# Patient Record
Sex: Female | Born: 1985 | Race: White | Hispanic: No | Marital: Married | Smoking: Never smoker
Health system: Southern US, Community
[De-identification: ages and names within clinical notes are randomized; demographics above are authoritative.]

## PROBLEM LIST (undated history)

## (undated) DIAGNOSIS — K219 Gastro-esophageal reflux disease without esophagitis: Secondary | ICD-10-CM

## (undated) DIAGNOSIS — R112 Nausea with vomiting, unspecified: Secondary | ICD-10-CM

## (undated) DIAGNOSIS — F988 Other specified behavioral and emotional disorders with onset usually occurring in childhood and adolescence: Secondary | ICD-10-CM

## (undated) DIAGNOSIS — Z87442 Personal history of urinary calculi: Secondary | ICD-10-CM

## (undated) DIAGNOSIS — Z87898 Personal history of other specified conditions: Secondary | ICD-10-CM

## (undated) DIAGNOSIS — G4730 Sleep apnea, unspecified: Secondary | ICD-10-CM

## (undated) DIAGNOSIS — L858 Other specified epidermal thickening: Secondary | ICD-10-CM

## (undated) DIAGNOSIS — J302 Other seasonal allergic rhinitis: Secondary | ICD-10-CM

## (undated) DIAGNOSIS — M1990 Unspecified osteoarthritis, unspecified site: Secondary | ICD-10-CM

## (undated) DIAGNOSIS — L309 Dermatitis, unspecified: Secondary | ICD-10-CM

## (undated) DIAGNOSIS — Z9889 Other specified postprocedural states: Secondary | ICD-10-CM

## (undated) DIAGNOSIS — J45909 Unspecified asthma, uncomplicated: Secondary | ICD-10-CM

## (undated) DIAGNOSIS — M419 Scoliosis, unspecified: Secondary | ICD-10-CM

## (undated) DIAGNOSIS — R52 Pain, unspecified: Secondary | ICD-10-CM

## (undated) DIAGNOSIS — G43909 Migraine, unspecified, not intractable, without status migrainosus: Secondary | ICD-10-CM

## (undated) HISTORY — PX: OTHER SURGICAL HISTORY: SHX169

---

## 2003-08-09 HISTORY — PX: CHOLECYSTECTOMY: SHX55

## 2010-01-06 HISTORY — PX: LITHOTRIPSY: SUR834

## 2013-10-24 ENCOUNTER — Other Ambulatory Visit: Payer: Self-pay | Admitting: Urology

## 2013-10-25 ENCOUNTER — Encounter (HOSPITAL_COMMUNITY)
Admission: RE | Admit: 2013-10-25 | Discharge: 2013-10-25 | Disposition: A | Discharge status: Home / Self Care | Payer: 59 | Source: Ambulatory Visit | Attending: Urology | Admitting: Urology

## 2013-10-25 ENCOUNTER — Encounter (HOSPITAL_COMMUNITY): Payer: Self-pay | Admitting: Pharmacy Technician

## 2013-10-25 ENCOUNTER — Encounter (HOSPITAL_COMMUNITY): Payer: Self-pay

## 2013-10-25 HISTORY — DX: Migraine, unspecified, not intractable, without status migrainosus: G43.909

## 2013-10-25 HISTORY — DX: Other specified postprocedural states: Z98.890

## 2013-10-25 HISTORY — DX: Unspecified asthma, uncomplicated: J45.909

## 2013-10-25 HISTORY — DX: Personal history of other specified conditions: Z87.898

## 2013-10-25 HISTORY — DX: Personal history of urinary calculi: Z87.442

## 2013-10-25 HISTORY — DX: Scoliosis, unspecified: M41.9

## 2013-10-25 HISTORY — DX: Other specified behavioral and emotional disorders with onset usually occurring in childhood and adolescence: F98.8

## 2013-10-25 HISTORY — DX: Pain, unspecified: R52

## 2013-10-25 HISTORY — DX: Other seasonal allergic rhinitis: J30.2

## 2013-10-25 HISTORY — DX: Other specified epidermal thickening: L85.8

## 2013-10-25 HISTORY — DX: Nausea with vomiting, unspecified: R11.2

## 2013-10-25 HISTORY — DX: Dermatitis, unspecified: L30.9

## 2013-10-25 LAB — BASIC METABOLIC PANEL
BUN: 13 mg/dL (ref 6–23)
CO2: 21 mEq/L (ref 19–32)
Calcium: 9.4 mg/dL (ref 8.4–10.5)
Chloride: 107 mEq/L (ref 96–112)
Creatinine, Ser: 0.75 mg/dL (ref 0.50–1.10)
GFR calc Af Amer: >90 mL/min (ref >90)
GLUCOSE: 92 mg/dL (ref 70–99)
POTASSIUM: 3.8 meq/L (ref 3.7–5.3)
SODIUM: 141 meq/L (ref 137–147)

## 2013-10-25 LAB — CBC
HCT: 41.8 % (ref 36.0–46.0)
Hemoglobin: 14.4 g/dL (ref 12.0–15.0)
MCH: 28.9 pg (ref 26.0–34.0)
MCHC: 34.4 g/dL (ref 30.0–36.0)
MCV: 83.9 fL (ref 78.0–100.0)
Platelets: 281 10*3/uL (ref 150–400)
RBC: 4.98 MIL/uL (ref 3.87–5.11)
RDW: 13.3 % (ref 11.5–15.5)
WBC: 5.7 10*3/uL (ref 4.0–10.5)

## 2013-10-25 LAB — HCG, SERUM, QUALITATIVE: PREG SERUM: NEGATIVE

## 2013-10-25 NOTE — Pre-Procedure Instructions (Signed)
EKG AND CXR NOT NEEDED PREOP

## 2013-10-25 NOTE — Patient Instructions (Addendum)
   Y[facility] SURGERY IS SCHEDUL[facility] AT [facility]    TOMORROW - Wednesday  2/18  REPORT TO  SHORT STAY CENTER AT:  1:45 PM      PHONE # FOR SHORT STAY IS [phone]  DO NOT EAT ANYTHING AFTER MIDNIGHT TONIGHT.   YOU MAY BRUSH YOUR TEETH.   NO FOOD, NO CHEWING GUM, NO MINTS, NO CANDIES, NO CHEWING TOBACCO. YOU MAY HAVE CLEAR LIQUIDS TO DRINK FROM MIDNIGHT UNTIL 9:45 AM DAY OF SURGERY - LIKE WATER, SODA.   NOTHING TO DRINK AFTER 9:45 AM DAY OF SURGERY.  PLEASE TAKE THE FOLLOWING MEDICATIONS THE AM OF YOUR SURGERY WITH A FEW SIPS OF WATER:  HYDROCODONE / ACETAMINOPHEN IF NEEDED; SINGULAIR, TOPIRAMATE ( TOPAMAX ).  MAY USE YOUR ZADITOR EYE DROPS AND FLONASE.  PLEASE USE YOUR QVAR AND ALBUTEROL INHALERS - BRING THE ALBUTEROL INHALER TO THE HOSPITAL.  DO NOT BRING VALUABLES, MONEY, CREDIT CARDS.  DO NOT WEAR JEWELRY, MAKE-UP, NAIL POLISH AND NO METAL PINS OR CLIPS IN YOUR HAIR. CONTACT LENS, DENTURES / PARTIALS, GLASSES SHOULD NOT BE WORN TO SURGERY AND IN MOST CASES-HEARING AIDS WILL NEED TO BE REMOVED.  BRING YOUR GLASSES CASE, ANY EQUIPMENT NEEDED FOR YOUR CONTACT LENS. FOR PATIENTS ADMITTED TO THE HOSPITAL--CHECK OUT TIME THE DAY OF DISCHARGE IS 11:00 AM.  ALL INPATIENT ROOMS ARE PRIVATE - WITH BATHROOM, TELEPHONE, TELEVISION AND WIFI INTERNET.  IF YOU ARE BEING DISCHARGED THE SAME DAY OF YOUR SURGERY--YOU CAN NOT DRIVE YOURSELF HOME--AND SHOULD NOT GO HOME ALONE BY TAXI OR BUS.  NO DRIVING OR OPERATING MACHINERY, OR MAKING LEGAL DECISIONS FOR 24 HOURS FOLLOWING ANESTHESIA / PAIN MEDICATIONS.  PLEASE MAKE ARRANGEMENTS FOR SOMEONE TO BE WITH YOU AT HOME THE FIRST 24 HOURS AFTER SURGERY. RESPONSIBLE DRIVER'S NAME / PHONE                                                   FAILURE TO FOLLOW THESE INSTRUCTIONS MAY RESULT IN THE CANCELLATION OF YOUR SURGERY. PLEASE BE AWARE THAT YOU MAY NEED ADDITIONAL BLOOD DRAWN DAY OF YOUR SURGERY  PATIENT SIGNATURE_________________________________

## 2013-10-26 ENCOUNTER — Encounter (HOSPITAL_COMMUNITY): Payer: 59 | Admitting: Anesthesiology

## 2013-10-26 ENCOUNTER — Ambulatory Visit (HOSPITAL_COMMUNITY): Payer: 59 | Admitting: Anesthesiology

## 2013-10-26 ENCOUNTER — Ambulatory Visit (HOSPITAL_COMMUNITY)
Admission: RE | Admit: 2013-10-26 | Discharge: 2013-10-26 | Disposition: A | Discharge status: Home / Self Care | Payer: 59 | Source: Ambulatory Visit | Attending: Urology | Admitting: Urology

## 2013-10-26 ENCOUNTER — Encounter (HOSPITAL_COMMUNITY)
Admission: RE | Disposition: A | Discharge status: Home / Self Care | Payer: Self-pay | Source: Ambulatory Visit | Attending: Urology

## 2013-10-26 ENCOUNTER — Encounter (HOSPITAL_COMMUNITY): Payer: Self-pay | Admitting: *Deleted

## 2013-10-26 DIAGNOSIS — R109 Unspecified abdominal pain: Secondary | ICD-10-CM | POA: Insufficient documentation

## 2013-10-26 DIAGNOSIS — N23 Unspecified renal colic: Secondary | ICD-10-CM | POA: Insufficient documentation

## 2013-10-26 DIAGNOSIS — E669 Obesity, unspecified: Secondary | ICD-10-CM | POA: Insufficient documentation

## 2013-10-26 DIAGNOSIS — K219 Gastro-esophageal reflux disease without esophagitis: Secondary | ICD-10-CM | POA: Insufficient documentation

## 2013-10-26 DIAGNOSIS — N200 Calculus of kidney: Secondary | ICD-10-CM

## 2013-10-26 DIAGNOSIS — J45909 Unspecified asthma, uncomplicated: Secondary | ICD-10-CM | POA: Insufficient documentation

## 2013-10-26 DIAGNOSIS — R350 Frequency of micturition: Secondary | ICD-10-CM | POA: Insufficient documentation

## 2013-10-26 DIAGNOSIS — Z79899 Other long term (current) drug therapy: Secondary | ICD-10-CM | POA: Insufficient documentation

## 2013-10-26 HISTORY — PX: CYSTOSCOPY WITH RETROGRADE PYELOGRAM, URETEROSCOPY AND STENT PLACEMENT: SHX5789

## 2013-10-26 SURGERY — CYSTOURETEROSCOPY, WITH RETROGRADE PYELOGRAM AND STENT INSERTION
Anesthesia: General | Laterality: Right

## 2013-10-26 MED ORDER — KETAMINE HCL 10 MG/ML IJ SOLN
INTRAMUSCULAR | Status: AC
  Filled 2013-10-26: qty 1

## 2013-10-26 MED ORDER — FENTANYL CITRATE 0.05 MG/ML IJ SOLN
INTRAMUSCULAR | Status: DC | PRN
  Administered 2013-10-26: 25 ug via INTRAVENOUS

## 2013-10-26 MED ORDER — BELLADONNA ALKALOIDS-OPIUM 16.2-60 MG RE SUPP
RECTAL | Status: AC
  Filled 2013-10-26: qty 1

## 2013-10-26 MED ORDER — BELLADONNA ALKALOIDS-OPIUM 16.2-60 MG RE SUPP
RECTAL | Status: DC | PRN
  Administered 2013-10-26: 1 via RECTAL

## 2013-10-26 MED ORDER — PROPOFOL 10 MG/ML IV BOLUS
INTRAVENOUS | Status: AC
  Filled 2013-10-26: qty 20

## 2013-10-26 MED ORDER — FENTANYL CITRATE 0.05 MG/ML IJ SOLN: 25.0000 ug | INTRAMUSCULAR | Status: DC | PRN

## 2013-10-26 MED ORDER — MIDAZOLAM HCL 5 MG/5ML IJ SOLN
INTRAMUSCULAR | Status: DC | PRN
  Administered 2013-10-26: 1 mg via INTRAVENOUS

## 2013-10-26 MED ORDER — KETAMINE HCL 10 MG/ML IJ SOLN
INTRAMUSCULAR | Status: DC | PRN
  Administered 2013-10-26: 25 mg via INTRAVENOUS

## 2013-10-26 MED ORDER — LACTATED RINGERS IV SOLN
INTRAVENOUS | Status: DC | PRN
  Administered 2013-10-26: 16:00:00 via INTRAVENOUS

## 2013-10-26 MED ORDER — PHENAZOPYRIDINE HCL 200 MG PO TABS: 200.0000 mg | ORAL_TABLET | Freq: Three times a day (TID) | ORAL | Status: DC | PRN

## 2013-10-26 MED ORDER — CIPROFLOXACIN IN D5W 400 MG/200ML IV SOLN
INTRAVENOUS | Status: AC
  Filled 2013-10-26: qty 200

## 2013-10-26 MED ORDER — IOHEXOL 300 MG/ML  SOLN
INTRAMUSCULAR | Status: DC | PRN
  Administered 2013-10-26: 30 mL via INTRAVENOUS

## 2013-10-26 MED ORDER — DEXAMETHASONE SODIUM PHOSPHATE 4 MG/ML IJ SOLN
INTRAMUSCULAR | Status: DC | PRN
  Administered 2013-10-26: 10 mg via INTRAVENOUS

## 2013-10-26 MED ORDER — MIDAZOLAM HCL 2 MG/2ML IJ SOLN
INTRAMUSCULAR | Status: AC
  Filled 2013-10-26: qty 2

## 2013-10-26 MED ORDER — LIDOCAINE HCL (CARDIAC) 20 MG/ML IV SOLN
INTRAVENOUS | Status: DC | PRN
  Administered 2013-10-26: 30 mg via INTRAVENOUS

## 2013-10-26 MED ORDER — CIPROFLOXACIN HCL 500 MG PO TABS: 500.0000 mg | ORAL_TABLET | Freq: Once | ORAL | Status: DC

## 2013-10-26 MED ORDER — SODIUM CHLORIDE 0.9 % IR SOLN
Status: DC | PRN
  Administered 2013-10-26: 1000 mL via INTRAVESICAL
  Administered 2013-10-26: 1000 mL

## 2013-10-26 MED ORDER — METOCLOPRAMIDE HCL 5 MG/ML IJ SOLN
INTRAMUSCULAR | Status: DC | PRN
  Administered 2013-10-26: 10 mg via INTRAVENOUS

## 2013-10-26 MED ORDER — CIPROFLOXACIN IN D5W 400 MG/200ML IV SOLN
400.0000 mg | INTRAVENOUS | Status: AC
  Administered 2013-10-26: 400 mg via INTRAVENOUS

## 2013-10-26 MED ORDER — PROPOFOL 10 MG/ML IV BOLUS
INTRAVENOUS | Status: DC | PRN
  Administered 2013-10-26: 15 mg via INTRAVENOUS
  Administered 2013-10-26: 25 mg via INTRAVENOUS
  Administered 2013-10-26: 175 mg via INTRAVENOUS

## 2013-10-26 MED ORDER — TROSPIUM CHLORIDE ER 60 MG PO CP24: 60.0000 mg | ORAL_CAPSULE | Freq: Every day | ORAL | Status: DC

## 2013-10-26 MED ORDER — LACTATED RINGERS IV SOLN
INTRAVENOUS | Status: DC
  Administered 2013-10-26: 1000 mL via INTRAVENOUS

## 2013-10-26 MED ORDER — PROMETHAZINE HCL 25 MG/ML IJ SOLN: 6.2500 mg | INTRAMUSCULAR | Status: DC | PRN

## 2013-10-26 MED ORDER — ONDANSETRON HCL 4 MG/2ML IJ SOLN
INTRAMUSCULAR | Status: DC | PRN
  Administered 2013-10-26: 2 mg via INTRAVENOUS
  Administered 2013-10-26: 75 mg via INTRAVENOUS
  Administered 2013-10-26: 2 mg via INTRAVENOUS

## 2013-10-26 MED ORDER — FENTANYL CITRATE 0.05 MG/ML IJ SOLN
INTRAMUSCULAR | Status: AC
  Filled 2013-10-26: qty 2

## 2013-10-26 SURGICAL SUPPLY — 14 items
BAG URO CATCHER STRL LF (DRAPE) ×4 IMPLANT
CATH URET 5FR 28IN CONE TIP (BALLOONS) ×2
CATH URET 5FR 70CM CONE TIP (BALLOONS) ×2 IMPLANT
CLOTH BEACON ORANGE TIMEOUT ST (SAFETY) ×4 IMPLANT
DRAPE CAMERA CLOSED 9X96 (DRAPES) ×4 IMPLANT
GLOVE BIOGEL M STRL SZ7.5 (GLOVE) ×4 IMPLANT
GOWN STRL REUS W/TWL XL LVL3 (GOWN DISPOSABLE) ×4 IMPLANT
GUIDEWIRE STR DUAL SENSOR (WIRE) ×4 IMPLANT
MANIFOLD NEPTUNE II (INSTRUMENTS) ×4 IMPLANT
PACK CYSTO (CUSTOM PROCEDURE TRAY) ×4 IMPLANT
STENT CONTOUR 6FRX24X.038 (STENTS) ×4 IMPLANT
TUBING CONNECTING 10 (TUBING) ×3 IMPLANT
TUBING CONNECTING 10' (TUBING) ×1
WIRE COONS/BENSON .038X145CM (WIRE) IMPLANT

## 2013-10-26 NOTE — Discharge Instructions (Signed)
DISCHARGE INSTRUCTIONS FOR KIDNEY STONES OR URETERAL STENT   MEDICATIONS:  1. DO NOT RESUME YOUR ASPIRIN, or any other medicines like ibuprofen, motrin, excedrin, advil, aleve, vitamin E, fish oil as these can all cause bleeding x 7 days.  2. Resume all your other meds from home.  3. Take Cipro one hour prior to removal of your stent.  4. Trospium is to prevent bladder spasms and help reduce urinary frequency. 5. Pyridium is to help with the burning/stinging when you urinate.   ACTIVITY:  1. No strenuous activity x 1week  2. No driving while on narcotic pain medications  3. Drink plenty of water  4. Continue to walk at home - you can still get blood clots when you are at home, so keep active, but don't over do it.  5. May return to work/school tomorrow or when you feel ready   BATHING:  1. You can shower and we recommend daily showers  2. You have a string coming from your urethra: The stent string is attached to your ureteral stent. Do not pull on this.   SIGNS/SYMPTOMS TO CALL:  Please call us if you have a fever greater than 101.5, uncontrolled nausea/vomiting, uncontrolled pain, dizziness, unable to urinate, bloody urine, chest pain, shortness of breath, leg swelling, leg pain, redness around wound, drainage from wound, or any other concerns or questions.   You can reach us at [phone].   FOLLOW-UP:  1. You have an appointment in 6 weeks with a ultrasound of your kidneys prior.  2. You have a string attached to your stent, you may remove it on Monday, Feb. 23rd. To do this, pull the strings until the stents are completely removed.  You may feel an odd sensation in your back.

## 2013-10-26 NOTE — Anesthesia Postprocedure Evaluation (Signed)
  Anesthesia Post-op Note  Patient: Tara Castaneda  Procedure(s) Performed: Procedure(s) (LRB): CYSTOSCOPY WITH RIGHT URETEROSCOPY,  AND RIGHT URETERAL STENT PLACEMENT,  LEFT CYSTOSCOPY WITH RETROGRADE (Right) HOLMIUM LASER APPLICATION (N/A)  Patient Location: PACU  Anesthesia Type: General  Level of Consciousness: awake and alert   Airway and Oxygen Therapy: Patient Spontanous Breathing  Post-op Pain: mild  Post-op Assessment: Post-op Vital signs reviewed, Patient's Cardiovascular Status Stable, Respiratory Function Stable, Patent Airway and No signs of Nausea or vomiting  Last Vitals:  Filed Vitals:   10/26/13 1820  BP: 119/81  Pulse: 79  Temp:   Resp: 20    Post-op Vital Signs: stable   Complications: No apparent anesthesia complications

## 2013-10-26 NOTE — Anesthesia Preprocedure Evaluation (Addendum)
Anesthesia Evaluation  Patient identified by MRN, date of birth, ID band Patient awake    Reviewed: Allergy & Precautions, H&P , NPO status , Patient's Chart, lab work & pertinent test results  History of Anesthesia Complications (+) PONV and history of anesthetic complications  Airway Mallampati: II TM Distance: >3 FB Neck ROM: Full    Dental no notable dental hx.    Pulmonary asthma ,  breath sounds clear to auscultation  Pulmonary exam normal       Cardiovascular negative cardio ROS  Rhythm:Regular Rate:Normal     Neuro/Psych  Headaches, PSYCHIATRIC DISORDERS    GI/Hepatic negative GI ROS, Neg liver ROS,   Endo/Other  negative endocrine ROS  Renal/GU Renal disease  negative genitourinary   Musculoskeletal negative musculoskeletal ROS (+)   Abdominal (+) + obese,   Peds negative pediatric ROS (+)  Hematology negative hematology ROS (+)   Anesthesia Other Findings   Reproductive/Obstetrics negative OB ROS                           Anesthesia Physical Anesthesia Plan  ASA: II  Anesthesia Plan: General   Post-op Pain Management:    Induction: Intravenous  Airway Management Planned: LMA  Additional Equipment:   Intra-op Plan:   Post-operative Plan: Extubation in OR  Informed Consent: I have reviewed the patients History and Physical, chart, labs and discussed the procedure including the risks, benefits and alternatives for the proposed anesthesia with the patient or authorized representative who has indicated his/her understanding and acceptance.   Dental advisory given  Plan Discussed with: CRNA  Anesthesia Plan Comments:         Anesthesia Quick Evaluation

## 2013-10-26 NOTE — H&P (Signed)
History of Present Illness [name] 3 weeks of intermittent right flank pain and frequency.     GU HX:  Aug 2014 initally seen as a referral from Tara Castaneda for establish care with significant history of nephrolithiasis.  Hx renal tubular acidosis. She has had 2 bouts of renal colic. The first one pass with conservative management for the second one required ureteral stent and shock wave lithotripsy. Her last surgical intervention was in 2012. Patient has subsequently undergone a extensive metabolic evaluation although I do not have those records currently have been requested. The patient states that she has performed with only 24-hour urinalysis several times and is currently being treated with potassium citrate 10 mEq 3 times a day. She tolerated this well. In addition to potassium citrate the patient was advised to start a low sodium diet.  basic metabolic panel which was performed in February 2013 showing a sodium 141, potassium 4.0, chloride 109, bicarbonate 23, BUN 10, creatinine 0.8, calcium 9.0.   The patient states that she eats a moderate amount of calcium or dairy, minimal amount of red meat, chicken, and fish. She has eliminated soda and her diet and drinks very little caffeine. She drinks a lot of water and nose that she should produce approximately 2 L of urine per day.   Interval HX:  Patient has been on medical explusion therapy for a right distal stone for about three weeks. She has doubled her flomax. She has not had any fevers. She continues to have pain. She did have similar pre-yesterday, but this morning it began again. She is ready to have this taken care of immediately. She is tired of the pain.   Past Medical History Problems  1. History of Asthma (493.90) 2. History of allergy (V15.09) 3. History of esophageal reflux (V12.79) 4. History of migraine headaches (V12.49)  Surgical History Problems  1. History of Arthroscopy Knee Right 2. History of  Arthroscopy Knee Right 3. History of Arthroscopy Knee Right 4. History of Gallbladder Surgery 5. History of Lithotripsy  Current Meds 1. Adderall 20 MG Oral Tablet;  Therapy: (Recorded:21Aug2014) to Recorded 2. Albuterol AERS;  Therapy: (Recorded:03Feb2015) to Recorded 3. Depo-Provera SOLN;  Therapy: (Recorded:03Feb2015) to Recorded 4. Eye Drops SOLN;  Therapy: (Recorded:21Aug2014) to Recorded 5. Flonase 50 MCG/ACT Nasal Suspension;  Therapy: (Recorded:21Aug2014) to Recorded 6. Hydrocodone-Acetaminophen 7.5-325 MG Oral Tablet; TAKE 1 TO 2 TABLETS EVERY 4  TO 6 HOURS AS NEEDED FOR PAIN;  Therapy: 03Feb2015 to (Evaluate:07Feb2015); Last Rx:03Feb2015 Ordered 7. HYDROmorphone HCl - 2 MG Oral Tablet; TAKE 1 TABLET EVERY 4 TO 6 HOURS AS  NEEDED FOR PAIN;  Therapy: 12Feb2015 to (Evaluate:16Feb2015); Last Rx:12Feb2015 Ordered 8. Migraine Relief TABS;  Therapy: (Recorded:03Feb2015) to Recorded 9. Ondansetron 8 MG Oral Tablet Dispersible; TAKE 1 TABLET Every 6 hours PRN nausea;  Therapy: 03Feb2015 to (Last Rx:03Feb2015)  Requested for: 03Feb2015 Ordered 10. Potassium Citrate ER 10 MEQ (1080 MG) Oral Tablet Extended Release; TAKE 1 TABLET   3 times daily;   Therapy: 21Aug2014 to Recorded 11. Qvar 40 MCG/ACT Inhalation Aerosol Solution;   Therapy: (Recorded:03Feb2015) to Recorded 12. Singulair 10 MG Oral Tablet;   Therapy: (Recorded:21Aug2014) to Recorded 13. Sprix 15.75 MG/SPRAY Nasal Solution; 1 squirt to each nostril Q6hrs prn;   Therapy: 12Feb2015 to (Last Rx:12Feb2015)  Requested for: 12Feb2015 Ordered 14. Tamsulosin HCl - 0.4 MG Oral Capsule; TAKE 1 CAPSULE Daily;   Therapy: 04Feb2015 to (Evaluate:06Mar2015)  Requested for: 04Feb2015; Last   Rx:04Feb2015 Ordered 15. Topamax 50  MG Oral Tablet;   Therapy: (Recorded:21Aug2014) to Recorded 16. Triamcinolone Acetonide 0.1 % External Cream;   Therapy: (Recorded:21Aug2014) to Recorded 17. Urea 40 % External Cream;   Therapy:  (Recorded:21Aug2014) to Recorded  Allergies Medication  1. Amoxicillin TABS 2. Penicillins 3. Retin-A CREA 4. Zithromax CAPS  Family History Problems  1. Family history of Diabetes Mellitus (V18.0) : Mother 2. Family history of Hypertension (V17.49) : Father 3. Family history of Hypertension (V17.49) : Brother 4. Family history of Multiple Sclerosis : Mother 5. Family history of Nephrolithiasis 6. Family history of Renal Failure : Maternal Grandmother  Social History Problems  1. Alcohol Use   3-4 per week 2. Caffeine Use 3. Marital History - Currently Married 4. Never A Smoker 5. Occupation:   Runner, broadcasting/film/video  Review of Systems No changes in pts bowel habits, neurological changes, or progressive lower urinary tract symptoms.    Physical Exam Constitutional: Well nourished and well developed . No acute distress.  ENT:. The ears and nose are normal in appearance.  Neck: The appearance of the neck is normal and no neck mass is present.  Pulmonary: No respiratory distress, normal respiratory rhythm and effort and clear bilateral breath sounds.  Cardiovascular: Heart rate and rhythm are normal . The arterial pulses are normal. No peripheral edema.  Abdomen: The abdomen is soft and nontender. No masses are palpated. No CVA tenderness. No hernias are palpable. No hepatosplenomegaly noted.  Genitourinary:  The bladder is tender.  Neuro/Psych:. Mood and affect are appropriate.    Results/Data Urine [Data Includes: Last 1 Day]   16Feb2015  COLOR YELLOW   APPEARANCE CLEAR   SPECIFIC GRAVITY <1.005   pH 7.0   GLUCOSE NEG mg/dL  BILIRUBIN NEG   KETONE NEG mg/dL  BLOOD NEG   PROTEIN NEG mg/dL  UROBILINOGEN 0.2 mg/dL  NITRITE NEG   LEUKOCYTE ESTERASE NEG     KUB was performed in the office today: The renal shadows are visible bilaterally. There appeared to be a calcification within the left renal pelvis. There are no calcifications within either of the expected trajectory of  the ureters. The visibility was limited in the pelvis due to constipation, however there is no evidence of any stones in her pelvis.1    1 Amended By: Berniece Salines; Oct 24 2013 1:01 PM EST  Assessment Right distal ureteral stone with intermittent renal colic, failed medical expulsion therapy.   Plan Calculus of distal right ureter  1. Follow-up Schedule Surgery Office  Follow-up  Status: Complete  Done: 16Feb2015 Health Maintenance  2. UA With REFLEX; [Do Not Release]; Status:Complete;   Done: 16Feb2015 11:54AM  Discussion/Summary I discussed the options with the patient including continued medical exposing therapy versus ureteroscopy. The patient is eager to get this behind her, as such we have scheduled her as an add-on case on Wednesday. She realizes that she will have a stent following renal has significant stent discomfort. I went over the risks and benefits of the operation in great detail patient would like to proceed.

## 2013-10-26 NOTE — Transfer of Care (Signed)
Immediate Anesthesia Transfer of Care Note  Patient: Tara Castaneda  Procedure(s) Performed: Procedure(s): CYSTOSCOPY WITH RIGHT URETEROSCOPY,  AND RIGHT URETERAL STENT PLACEMENT,  LEFT CYSTOSCOPY WITH RETROGRADE (Right) HOLMIUM LASER APPLICATION (N/A)  Patient Location: PACU  Anesthesia Type:General  Level of Consciousness: sedated  Airway & Oxygen Therapy: Patient Spontanous Breathing and Patient connected to face mask oxygen  Post-op Assessment: Report given to PACU RN and Post -op Vital signs reviewed and stable  Post vital signs: Reviewed and stable  Complications: No apparent anesthesia complications

## 2013-10-26 NOTE — Op Note (Signed)
Preoperative diagnosis: right ureteral calculus  Postoperative diagnosis: negative ureteral exploration - passed ureteral stone  Procedure:  1. Cystoscopy 2. right ureteroscopy  3. right 18F x 24cm ureteral stent placement  4. bilateral retrograde pyelography with interpretation  Surgeon: Crist FatBenjamin W. Blakeleigh Domek, MD  Anesthesia: General  Complications: None  Intraoperative findings: Right retrograde pyelogram revealed no filling defect. Right rigid ureteroscopy revealed no stone. Right flexible ureteral pyeloscopy revealed no stone within the right renal pelvis. There was a negative exploration of the right upper tract, no stone found. Because of the negative exploration a left-sided retrograde pyelogram was performed. The ureter caliber was normal, there was no hydronephrosis or filling defect within the left collecting system.  EBL: Minimal  Specimens: 1. None  Disposition of specimens: [facility] for stone analysis  Indication: Tara Castaneda is a 28 y.o.   patient with urolithiasis. After reviewing the management options for treatment, the patient elected to proceed with the above surgical procedure(s). We have discussed the potential benefits and risks of the procedure, side effects of the proposed treatment, the likelihood of the patient achieving the goals of the procedure, and any potential problems that might occur during the procedure or recuperation. Informed consent has been obtained.  Description of procedure:  The patient was taken to the operating room and general anesthesia was induced.  The patient was placed in the dorsal lithotomy position, prepped and draped in the usual sterile fashion, and preoperative antibiotics were administered. A preoperative time-out was performed.   Cystourethroscopy was performed.  The patient's urethra was examined and was normal. The bladder was then systematically examined in its entirety. There was no evidence for  any bladder tumors, stones, or other mucosal pathology.    Attention then turned to the right ureteral orifice and a ureteral catheter was used to intubate the ureteral orifice.  Omnipaque contrast was injected through the ureteral catheter and a retrograde pyelogram was performed with findings as dictated above.  A 0.38 sensor guidewire was then advanced up the right ureter into the renal pelvis under fluoroscopic guidance. The 6 Fr semirigid ureteroscope was then advanced into the ureter next to the guidewire and no calculus was identified. The rigid scope was then backed out of the ureter and exchanged for a flexible ureteroscope. It the ureteroscope was gently passed to the patient's urethra and into the right ureteral orifice and into the right renal pelvis under video guidance. Pyeloscopy was performed to systematically using fluoroscopy to confirm our position. There were no stones identified within the right renal pelvis or right calyces. The scope was then backed gently out the ureter.  A rigid cystoscope was then gently passed through the urethra and into the bladder under visual guidance. Using a 5 [place] open-ended ureteral Julius Bowelsollock a retrograde pyelogram was performed on the left side with the above findings.   No stones were identified and either ureter.  A ureteral stent was advance over the wire using Seldinger technique. The stent was advanced over the wire into the right renal pelvis. The stent was positioned appropriately under fluoroscopic guidance.  The wire was then removed with an adequate stent curl noted in the renal pelvis as well as in the bladder.  The bladder was then emptied and the procedure ended.  The patient appeared to tolerate the procedure well and without complications.  The patient was able to be awakened and transferred to the recovery unit in satisfactory condition.   Disposition: The tether of the stent was  left on and tucked inside the patient's vagina.  Instructions for removing the stent have been provided to the patient. This has been scheduled for followup in 6 weeks with a renal ultrasound.

## 2013-10-27 ENCOUNTER — Encounter (HOSPITAL_COMMUNITY): Payer: Self-pay | Admitting: Urology

## 2013-12-26 ENCOUNTER — Emergency Department (HOSPITAL_COMMUNITY): Payer: 59

## 2013-12-26 ENCOUNTER — Encounter (HOSPITAL_COMMUNITY): Payer: Self-pay | Admitting: Emergency Medicine

## 2013-12-26 ENCOUNTER — Emergency Department (HOSPITAL_COMMUNITY)
Admission: EM | Admit: 2013-12-26 | Discharge: 2013-12-26 | Disposition: A | Discharge status: Home / Self Care | Payer: 59 | Attending: Emergency Medicine | Admitting: Emergency Medicine

## 2013-12-26 DIAGNOSIS — Z87768 Personal history of other specified (corrected) congenital malformations of integument, limbs and musculoskeletal system: Secondary | ICD-10-CM | POA: Insufficient documentation

## 2013-12-26 DIAGNOSIS — J45901 Unspecified asthma with (acute) exacerbation: Secondary | ICD-10-CM | POA: Insufficient documentation

## 2013-12-26 DIAGNOSIS — G43909 Migraine, unspecified, not intractable, without status migrainosus: Secondary | ICD-10-CM | POA: Insufficient documentation

## 2013-12-26 DIAGNOSIS — Z87442 Personal history of urinary calculi: Secondary | ICD-10-CM | POA: Insufficient documentation

## 2013-12-26 DIAGNOSIS — Z880 Allergy status to penicillin: Secondary | ICD-10-CM | POA: Insufficient documentation

## 2013-12-26 DIAGNOSIS — Z872 Personal history of diseases of the skin and subcutaneous tissue: Secondary | ICD-10-CM | POA: Insufficient documentation

## 2013-12-26 DIAGNOSIS — Z8739 Personal history of other diseases of the musculoskeletal system and connective tissue: Secondary | ICD-10-CM | POA: Insufficient documentation

## 2013-12-26 DIAGNOSIS — Z79899 Other long term (current) drug therapy: Secondary | ICD-10-CM | POA: Insufficient documentation

## 2013-12-26 DIAGNOSIS — IMO0002 Reserved for concepts with insufficient information to code with codable children: Secondary | ICD-10-CM | POA: Insufficient documentation

## 2013-12-26 DIAGNOSIS — Z87448 Personal history of other diseases of urinary system: Secondary | ICD-10-CM | POA: Insufficient documentation

## 2013-12-26 DIAGNOSIS — Z8776 Personal history of (corrected) congenital malformations of integument, limbs and musculoskeletal system: Secondary | ICD-10-CM | POA: Insufficient documentation

## 2013-12-26 DIAGNOSIS — F988 Other specified behavioral and emotional disorders with onset usually occurring in childhood and adolescence: Secondary | ICD-10-CM | POA: Insufficient documentation

## 2013-12-26 LAB — BASIC METABOLIC PANEL
BUN: 10 mg/dL (ref 6–23)
CALCIUM: 9.7 mg/dL (ref 8.4–10.5)
CHLORIDE: 110 meq/L (ref 96–112)
CO2: 17 mEq/L — ABNORMAL LOW (ref 19–32)
CREATININE: 0.68 mg/dL (ref 0.50–1.10)
GFR calc Af Amer: >90 mL/min (ref >90)
Glucose, Bld: 100 mg/dL — ABNORMAL HIGH (ref 70–99)
Potassium: 3.5 mEq/L — ABNORMAL LOW (ref 3.7–5.3)
Sodium: 144 mEq/L (ref 137–147)

## 2013-12-26 LAB — CBC
HEMATOCRIT: 42.6 % (ref 36.0–46.0)
Hemoglobin: 14.8 g/dL (ref 12.0–15.0)
MCH: 28.9 pg (ref 26.0–34.0)
MCHC: 34.7 g/dL (ref 30.0–36.0)
MCV: 83.2 fL (ref 78.0–100.0)
PLATELETS: 305 10*3/uL (ref 150–400)
RBC: 5.12 MIL/uL — ABNORMAL HIGH (ref 3.87–5.11)
RDW: 13.7 % (ref 11.5–15.5)
WBC: 7.3 10*3/uL (ref 4.0–10.5)

## 2013-12-26 LAB — I-STAT TROPONIN, ED: Troponin i, poc: 0 ng/mL (ref 0.00–0.08)

## 2013-12-26 MED ORDER — PREDNISONE 20 MG PO TABS
60.0000 mg | ORAL_TABLET | Freq: Once | ORAL | Status: AC
  Administered 2013-12-26: 60 mg via ORAL
  Filled 2013-12-26: qty 3

## 2013-12-26 MED ORDER — ALBUTEROL SULFATE HFA 108 (90 BASE) MCG/ACT IN AERS
6.0000 | INHALATION_SPRAY | Freq: Once | RESPIRATORY_TRACT | Status: AC
Start: 2013-12-26 — End: 2013-12-26
  Administered 2013-12-26: 6 via RESPIRATORY_TRACT
  Filled 2013-12-26: qty 6.7

## 2013-12-26 MED ORDER — PREDNISONE 20 MG PO TABS: 40.0000 mg | ORAL_TABLET | Freq: Every day | ORAL | Status: DC

## 2013-12-26 NOTE — ED Notes (Signed)
PT is here with asthma attack and lungs sound clear.  Pt reports chest tightness.

## 2013-12-26 NOTE — ED Provider Notes (Signed)
CSN: [id number]     Arrival date & time 12/26/13  1202 History   First MD Initiated Contact with Patient 12/26/13 1324     Chief Complaint  Patient presents with  . Shortness of Breath  . Chest Pain     (Consider location/radiation/quality/duration/timing/severity/associated sxs/prior Treatment) The history is provided by the patient.   history of present illness: General female who presents with chief complaint of shortness of breath and chest tightness. Patient's symptoms became acutely worse this morning but she has had symptoms over the course of the past several days. Patient reports severe allergies to mold and mildew. 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 Symptoms are associated with a sensation of chest tightness. She took her albuterol inhaler with some relief but shortness of breath has not completely resolved. No recent fevers, cough, or other recent illnesses.  Past Medical History  Diagnosis Date  . Seasonal allergies     MOLD AND MILDEW  . Asthma     MILD - NO RECENT FLARE UPS  . ADD (attention deficit disorder)   . History of kidney stones   . Migraines   . History of heartburn   . Scoliosis   . Pain     HX OF STRAINED BACK SINCE ACCIDENT IN CHILDHOOD   . Eczema     MOSTLY ON HANDS - OCCAS PATCHES ELSWHERE  . KP (keratosis pilaris)     UPPER ARMS  . PONV (postoperative nausea and vomiting)   . Renal tubular acidosis    Past Surgical History  Procedure Laterality Date  . Lithotripsy  MAY 2011  . Right knee arthroscopy      JAN 2003 AND MARCH, AND DEC 2004 AND AUGUST 2008  . Cholecystectomy  DEC 2004  . Cystoscopy with retrograde pyelogram, ureteroscopy and stent placement Right 10/26/2013    Procedure: CYSTOSCOPY WITH RIGHT URETEROSCOPY,  AND RIGHT URETERAL STENT PLACEMENT,  LEFT CYSTOSCOPY WITH RETROGRADE;  Surgeon: Crist FatBenjamin W Herrick, MD;  Location: [facility];  Service:  Urology;  Laterality: Right;   No family history on file. History  Substance Use Topics  . Smoking status: Never Smoker   . Smokeless tobacco: Never Used  . Alcohol Use: Yes     Comment: COUPLE OF GLASSES OF WINE OR COUPLE OF BEERS - PER WEEK   OB History   Grav Para Term Preterm Abortions TAB SAB Ect Mult Living                 Review of Systems  Constitutional: Negative for fever and chills.  HENT: Negative for congestion.   Eyes: Negative for pain.  Respiratory: Positive for shortness of breath and wheezing.   Cardiovascular: Negative for chest pain.  Gastrointestinal: Negative for nausea, vomiting, abdominal pain, diarrhea and constipation.  Genitourinary: Negative for dysuria.  Musculoskeletal: Negative for back pain.  Skin: Negative for rash and wound.  Neurological: Negative for headaches.  All other systems reviewed and are negative.     Allergies  Amoxicillin; Penicillins; Zithromax; and Tretinoin  Home Medications   Prior to Admission medications   Medication Sig Start Date End Date Taking? Authorizing Provider  albuterol (PROVENTIL HFA;VENTOLIN HFA) 108 (90 BASE) MCG/ACT inhaler Inhale 1 puff into the lungs every 6 (six) hours as needed for wheezing or shortness of breath.    Historical Provider, MD  amphetamine-dextroamphetamine (ADDERALL XR) 20 MG 24 hr capsule Take 20 mg by mouth every morning.  Historical Provider, MD  beclomethasone (QVAR) 40 MCG/ACT inhaler Inhale 2 puffs into the lungs 2 (two) times daily.    Historical Provider, MD  chlorpheniramine-pseudoephedrine-acetaminophen (SINUTAB) 2-30-500 MG per tablet Take 1 tablet by mouth every 4 (four) hours as needed for allergies or congestion.    Historical Provider, MD  ciprofloxacin (CIPRO) 500 MG tablet Take 1 tablet (500 mg total) by mouth once. 10/26/13   Crist Fat, MD  fluticasone (FLONASE) 50 MCG/ACT nasal spray Place 1 spray into both nostrils daily.    Historical Provider, MD   HYDROcodone-acetaminophen (NORCO) 7.5-325 MG per tablet Take 1-2 tablets by mouth every 4 (four) hours as needed for moderate pain.    Historical Provider, MD  HYDROmorphone (DILAUDID) 2 MG tablet Take 2 mg by mouth every 4 (four) hours as needed for moderate pain or severe pain.    Historical Provider, MD  ketorolac (TORADOL) 10 MG tablet Take 10 mg by mouth every 6 (six) hours as needed for moderate pain.    Historical Provider, MD  ketotifen (ZADITOR) 0.025 % ophthalmic solution Place 1 drop into both eyes daily.    Historical Provider, MD  levocetirizine (XYZAL) 5 MG tablet Take 5 mg by mouth every evening.    Historical Provider, MD  medroxyPROGESTERone (DEPO-PROVERA) 150 MG/ML injection Inject 150 mg into the muscle every 3 (three) months.    Historical Provider, MD  montelukast (SINGULAIR) 10 MG tablet Take 10 mg by mouth every morning.     Historical Provider, MD  phenazopyridine (PYRIDIUM) 200 MG tablet Take 1 tablet (200 mg total) by mouth 3 (three) times daily as needed for pain. 10/26/13   Crist Fat, MD  potassium citrate (UROCIT-K) 10 MEQ (1080 MG) SR tablet Take 10 mEq by mouth 3 (three) times daily with meals.    Historical Provider, MD  tamsulosin (FLOMAX) 0.4 MG CAPS capsule Take 0.8 mg by mouth daily after supper.    Historical Provider, MD  topiramate (TOPAMAX) 50 MG tablet Take 50-100 mg by mouth 2 (two) times daily. Takes 1 in the morning and 2 at bedtime    Historical Provider, MD  triamcinolone cream (KENALOG) 0.1 % Apply 1 application topically 2 (two) times daily.    Historical Provider, MD  Trospium Chloride 60 MG CP24 Take 1 capsule (60 mg total) by mouth daily. 10/26/13   Crist Fat, MD  urea (CARMOL) 10 % cream Apply 1 application topically 2 (two) times daily.    Historical Provider, MD   BP 131/83  Pulse 94  Temp(Src) 98.4 F (36.9 C) (Oral)  Resp 19  Wt 185 lb (83.915 kg)  SpO2 100% Physical Exam  Nursing note and vitals  reviewed. Constitutional: She is oriented to person, place, and time. She appears well-developed and well-nourished. No distress.  HENT:  Head: Normocephalic and atraumatic.  Eyes: Conjunctivae are normal.  Neck: Neck supple.  Cardiovascular: Normal rate, regular rhythm, normal heart sounds and intact distal pulses.   Pulmonary/Chest: Effort normal. No respiratory distress. She has wheezes (diffuse end expiratory). She has no rales.  Abdominal: Soft. She exhibits no distension. There is no tenderness.  Musculoskeletal: Normal range of motion.  Neurological: She is alert and oriented to person, place, and time.  Skin: Skin is warm and dry.    ED Course  Procedures (including critical care time) Labs Review Labs Reviewed  CBC - Abnormal; Notable for the following:    RBC 5.12 (*)    All other components within normal limits  BASIC METABOLIC PANEL - Abnormal; Notable for the following:    Potassium 3.5 (*)    CO2 17 (*)    Glucose, Bld 100 (*)    All other components within normal limits  I-STAT TROPOININ, ED    Imaging Review Dg Chest 2 View  12/26/2013   CLINICAL DATA:  Mid chest pain, shortness of breath, history asthma  EXAM: CHEST  2 VIEW  COMPARISON:  None  FINDINGS: Normal heart size, mediastinal contours, and pulmonary vascularity.  Lungs clear.  No pneumothorax.  Bones unremarkable.  IMPRESSION: Normal exam.   Electronically Signed   By: Ulyses SouthwardMark  Boles M.D.   On: 12/26/2013 13:08     EKG Interpretation None      MDM   Final diagnoses:  Asthma exacerbation    28 year old female with history of asthma and severe seasonal allergies who presents for an acute asthma exacerbation. AF VSS. Exam with mild end expiratory wheezing bilaterally.  No signs of acute infectious process or pneumonia. Labs obtained as part of triaged protocol with negative troponin. Patient without any chest pain, has no risk factors, and have low suspicion for ACS. Do not feel additional troponins  needed.  Patient given albuterol and prednisone 60 mg in the ED with significant improvement in symptoms. Patient appropriate for discharge home with continued albuterol and five-day burst of prednisone.  Cherre RobinsBryan Shya Kovatch, MD 12/26/13 [phone]

## 2013-12-27 NOTE — ED Provider Notes (Signed)
I saw and evaluated the patient, reviewed the resident's note and I agree with the findings and plan.   EKG Interpretation None      Pt w hx asthma, c/o recent allergy symptoms, chest congestion, coughing.  Mild wheezing on exam. Alb tx. cxr.  Suzi RootsKevin E Chamya Hunton, MD 12/27/13 [phone]

## 2014-01-13 ENCOUNTER — Ambulatory Visit: Payer: Self-pay | Admitting: Podiatrist

## 2014-01-27 ENCOUNTER — Ambulatory Visit: Payer: Self-pay | Admitting: Podiatrist

## 2014-02-03 ENCOUNTER — Ambulatory Visit: Payer: Self-pay | Admitting: Podiatrist

## 2014-02-16 ENCOUNTER — Encounter: Payer: Self-pay | Admitting: Podiatry

## 2014-02-16 ENCOUNTER — Ambulatory Visit (INDEPENDENT_AMBULATORY_CARE_PROVIDER_SITE_OTHER): Payer: 59

## 2014-02-16 ENCOUNTER — Ambulatory Visit (INDEPENDENT_AMBULATORY_CARE_PROVIDER_SITE_OTHER): Payer: 59 | Admitting: Podiatry

## 2014-02-16 VITALS — BP 137/75 | HR 93 | Resp 16 | Ht 63.0 in | Wt 190.0 lb

## 2014-02-16 DIAGNOSIS — M6740 Ganglion, unspecified site: Secondary | ICD-10-CM

## 2014-02-16 DIAGNOSIS — M7750 Other enthesopathy of unspecified foot: Secondary | ICD-10-CM

## 2014-02-16 DIAGNOSIS — M898X9 Other specified disorders of bone, unspecified site: Secondary | ICD-10-CM

## 2014-02-16 MED ORDER — TRIAMCINOLONE ACETONIDE 10 MG/ML IJ SUSP
10.0000 mg | Freq: Once | INTRAMUSCULAR | Status: AC
  Administered 2014-02-16: 10 mg

## 2014-02-16 NOTE — Progress Notes (Signed)
   Subjective:    Patient ID: Tara Castaneda, female    DOB: 1985/12/16, 28 y.o.   MRN: [id number]  HPI Comments: "I have like a cyst or something"  Patient c/o aching dorsal foot right for several months. She has a noticeable knot. Uncomfortable with shoes. No treatment.  Foot Pain Associated symptoms include headaches.      Review of Systems  Neurological: Positive for headaches.  All other systems reviewed and are negative.      Objective:   Physical Exam        Assessment & Plan:

## 2014-02-20 NOTE — Progress Notes (Signed)
Subjective:     Patient ID: Tara Castaneda, female   DOB: 10/26/1985, 28 y.o.   MRN: [id number]  Foot Pain   patient presents with pain on the top of the right foot that has been present for several months with a nodule formation that she does not remember before. States that it is painful and hard to wear certain types of shoe   Review of Systems  All other systems reviewed and are negative.      Objective:   Physical Exam  Nursing note and vitals reviewed. Constitutional: She is oriented to person, place, and time.  Cardiovascular: Intact distal pulses.   Neurological: She is oriented to person, place, and time.  Skin: Skin is warm.   neurovascular status is found to be intact with a lesion on top of the right foot that does have some probable cystic changes but it does appear to be more ganglionic with possibility for exostotic lesion noted     Assessment:     Probable ganglionic cyst or possible tendinitis right dorsal foot with possible exostosis    Plan:     H&P and x-ray reviewed and careful dorsal injection administered to try to shrink this. Reappoint to reevaluate in around 4 weeks or if any issues should occur

## 2014-03-09 ENCOUNTER — Ambulatory Visit (INDEPENDENT_AMBULATORY_CARE_PROVIDER_SITE_OTHER): Payer: 59 | Admitting: Podiatry

## 2014-03-09 ENCOUNTER — Encounter: Payer: Self-pay | Admitting: Podiatry

## 2014-03-09 ENCOUNTER — Ambulatory Visit: Payer: 59 | Admitting: Podiatry

## 2014-03-09 VITALS — BP 118/70 | HR 100 | Resp 16

## 2014-03-09 DIAGNOSIS — M898X9 Other specified disorders of bone, unspecified site: Secondary | ICD-10-CM

## 2014-03-09 NOTE — Patient Instructions (Signed)
Pre-Operative Instructions  Congratulations, you have decided to take an important step to improving your quality of life.  You can be assured that the doctors of [facility] will be with you every step of the way.  1. Plan to be at the surgery center/hospital at least 1 (one) hour prior to your scheduled time unless otherwise directed by the surgical center/hospital staff.  You must have a responsible adult accompany you, remain during the surgery and drive you home.  Make sure you have directions to the surgical center/hospital and know how to get there on time. 2. For hospital based surgery you will need to obtain a history and physical form from your family physician within 1 month prior to the date of surgery- we will give you a form for you primary physician.  3. We make every effort to accommodate the date you request for surgery.  There are however, times where surgery dates or times have to be moved.  We will contact you as soon as possible if a change in schedule is required.   4. No Aspirin/Ibuprofen for one week before surgery.  If you are on aspirin, any non-steroidal anti-inflammatory medications (Mobic, Aleve, Ibuprofen) you should stop taking it 7 days prior to your surgery.  You make take Tylenol  For pain prior to surgery.  5. Medications- If you are taking daily heart and blood pressure medications, seizure, reflux, allergy, asthma, anxiety, pain or diabetes medications, make sure the surgery center/hospital is aware before the day of surgery so they may notify you which medications to take or avoid the day of surgery. 6. No food or drink after midnight the night before surgery unless directed otherwise by surgical center/hospital staff. 7. No alcoholic beverages 24 hours prior to surgery.  No smoking 24 hours prior to or 24 hours after surgery. 8. Wear loose pants or shorts- loose enough to fit over bandages, boots, and casts. 9. No slip on shoes, sneakers are best. 10. Bring  your boot with you to the surgery center/hospital.  Also bring crutches or a walker if your physician has prescribed it for you.  If you do not have this equipment, it will be provided for you after surgery. 11. If you have not been contracted by the surgery center/hospital by the day before your surgery, call to confirm the date and time of your surgery. 12. Leave-time from work may vary depending on the type of surgery you have.  Appropriate arrangements should be made prior to surgery with your employer. 13. Prescriptions will be provided immediately following surgery by your doctor.  Have these filled as soon as possible after surgery and take the medication as directed. 14. Remove nail polish on the operative foot. 15. Wash the night before surgery.  The night before surgery wash the foot and leg well with the antibacterial soap provided and water paying special attention to beneath the toenails and in between the toes.  Rinse thoroughly with water and dry well with a towel.  Perform this wash unless told not to do so by your physician.  Enclosed: 1 Ice pack (please put in freezer the night before surgery)   1 Hibiclens skin cleaner   Pre-op Instructions  If you have any questions regarding the instructions, do not hesitate to call our office.  [place]: [address] [place], [place] [address] [phone]  [place]: [address] [place], [place] [address] [phone]  [place]: [address]  [place], [place] [address] [phone]  Dr. Richard   Tuchman DPM, Dr. Dalaina Tates DPM Dr. Richard Sikora DPM, Dr. M. Todd Hyatt DPM, Dr. Kathryn Egerton DPM 

## 2014-03-09 NOTE — Progress Notes (Signed)
Subjective:     Patient ID: Tara Castaneda, female   DOB: 01/21/1986, 28 y.o.   MRN: [id number]  HPI patient states that it still hurts on top of my right foot and it may be the swelling is down a little bit but I still can't wear shoes and I would like to have it fixed   Review of Systems     Objective:   Physical Exam Neurovascular status intact with no change in health history and noted to have on the dorsum of the right foot just proximal and lateral to the first metatarsocuneiform joint a prominent bone spur formation with pain when palpated    Assessment:     Probable irritation secondary to bone spurring with possibility for nerve entrapment or cyst formation    Plan:     Reviewed condition at great length and patient wants surgery. I've recommended removal of spur explaining there is no guarantee that this will solve the problem. She is willing to accept this risk wants to procedure and I allowed her to read a consent form line byline explaining all possible complications that can occur with the procedure such as this and patient wants surgery signed consent form and is scheduled for outpatient surgery in the next 4 weeks. Given preoperative instructions and encouraged to call with any other questions

## 2014-03-27 ENCOUNTER — Telehealth: Payer: Self-pay | Admitting: *Deleted

## 2014-03-27 NOTE — Telephone Encounter (Signed)
I just want to call and verify my surgery date.  It's supposed to be 08/04.  I may have to reschedule due to my job.  I informed her it is scheduled for August 4th.  I asked if she wanted to reschedule.  She stated she would have to call back, have to check with her job.

## 2014-04-04 ENCOUNTER — Telehealth: Payer: Self-pay | Admitting: *Deleted

## 2014-04-04 NOTE — Telephone Encounter (Signed)
I called and left her a message that we have rescheduled her surgery to 04/18/14.  Surgery center should call you a day or two before your surgery date and give you your arrival time.  If you have any further questions, please give me a call.  Dr. Charlsie Merlesegal was informed.

## 2014-04-04 NOTE — Telephone Encounter (Signed)
I have surgery scheduled for next Tuesday.  My work is still not able to give me the time off.  If at all possible, I'd like to schedule for the following week, August 11th.

## 2014-04-08 HISTORY — PX: EXOSTECTOMY: SHX1542

## 2014-04-17 ENCOUNTER — Encounter: Payer: 59 | Admitting: Podiatry

## 2014-04-18 DIAGNOSIS — M898X9 Other specified disorders of bone, unspecified site: Secondary | ICD-10-CM

## 2014-04-19 ENCOUNTER — Telehealth: Payer: Self-pay | Admitting: *Deleted

## 2014-04-19 NOTE — Telephone Encounter (Signed)
Had procedure yesterday.  I'm wondering if I can remove the thin stocking?  It's gotten loose and is annoying.  I have other large socks I can put back over it.

## 2014-04-19 NOTE — Telephone Encounter (Signed)
I called and left her a message that it is okay to remove the stockinette.  Be careful removing regular socks to ensure you do not remove your dressing.  Call if you have any further questions.

## 2014-04-20 ENCOUNTER — Telehealth: Payer: Self-pay

## 2014-04-20 NOTE — Telephone Encounter (Signed)
Left message for pt to call with questions or concerns  

## 2014-04-21 ENCOUNTER — Encounter: Payer: Self-pay | Admitting: Podiatry

## 2014-04-21 NOTE — Progress Notes (Signed)
Dr Charlsie Merlesegal performed a right tarsal exostectomy on 04/18/14

## 2014-04-24 ENCOUNTER — Ambulatory Visit (INDEPENDENT_AMBULATORY_CARE_PROVIDER_SITE_OTHER): Payer: 59

## 2014-04-24 ENCOUNTER — Encounter: Payer: 59 | Admitting: Podiatry

## 2014-04-24 ENCOUNTER — Encounter: Payer: Self-pay | Admitting: Podiatry

## 2014-04-24 ENCOUNTER — Ambulatory Visit (INDEPENDENT_AMBULATORY_CARE_PROVIDER_SITE_OTHER): Payer: No Typology Code available for payment source | Admitting: Podiatry

## 2014-04-24 VITALS — BP 123/70 | HR 99 | Resp 16

## 2014-04-24 DIAGNOSIS — M898X9 Other specified disorders of bone, unspecified site: Secondary | ICD-10-CM

## 2014-04-26 NOTE — Progress Notes (Signed)
Subjective:     Patient ID: Tara Castaneda, female   DOB: 05/17/1986, 28 y.o.   MRN: [id number]  HPI patient states that she's doing fine with mild discomfort still present but improved one week after foot surgery right  Review of Systems     Objective:   Physical Exam Neurovascular status intact with muscle strength adequate and excellent alignment of the incision site dorsum right foot    Assessment:     Doing well after having tarsal exostectomy right    Plan:     Advised on continuation of open toed shoe and applied Ace wrap dressing in order to continue to reduce compression reappoint 3 weeks earlier if necessary

## 2014-05-22 ENCOUNTER — Ambulatory Visit (INDEPENDENT_AMBULATORY_CARE_PROVIDER_SITE_OTHER): Payer: No Typology Code available for payment source | Admitting: Podiatry

## 2014-05-22 ENCOUNTER — Encounter: Payer: 59 | Admitting: Podiatry

## 2014-05-22 ENCOUNTER — Ambulatory Visit (INDEPENDENT_AMBULATORY_CARE_PROVIDER_SITE_OTHER): Payer: No Typology Code available for payment source

## 2014-05-22 VITALS — BP 110/64 | HR 74 | Resp 17

## 2014-05-22 DIAGNOSIS — Z9889 Other specified postprocedural states: Secondary | ICD-10-CM

## 2014-05-22 DIAGNOSIS — M898X9 Other specified disorders of bone, unspecified site: Secondary | ICD-10-CM

## 2014-05-22 NOTE — Progress Notes (Signed)
   Subjective:    Patient ID: Tara Castaneda, female    DOB: [birth year], 29 y.o.   MRN: [id number]  HPI  Pt presents as POV 4 weeks, DOS 04/18/14. Mild pain and swelling  Review of Systems     Objective:   Physical Exam        Assessment & Plan:

## 2014-05-23 NOTE — Progress Notes (Signed)
Subjective:     Patient ID: Tara Castaneda, female   DOB: Apr 24, 1986, 28 y.o.   MRN: [id number]  HPI patient presents stating I'm doing very well with my surgery and I'm just starting to wear closed in shoes   Review of Systems     Objective:   Physical Exam Neurovascular status intact with mild discomfort dorsum left foot and edema with wound edges that are well coapted incision with no drainage or other issues noted    Assessment:     Healing well post tarsal exostectomy    Plan:     Reviewed x-rays and advised on slowly returning to normal shoes the importance of continued elevation and compression. Reappoint as symptoms indicate

## 2014-07-18 ENCOUNTER — Other Ambulatory Visit: Payer: Self-pay | Admitting: Gynecology

## 2014-07-18 DIAGNOSIS — N6310 Unspecified lump in the right breast, unspecified quadrant: Principal | ICD-10-CM

## 2014-07-18 DIAGNOSIS — N6315 Unspecified lump in the right breast, overlapping quadrants: Secondary | ICD-10-CM

## 2014-07-28 ENCOUNTER — Ambulatory Visit
Admission: RE | Admit: 2014-07-28 | Discharge: 2014-07-28 | Disposition: A | Discharge status: Home / Self Care | Payer: No Typology Code available for payment source | Source: Ambulatory Visit | Attending: Gynecology | Admitting: Gynecology

## 2014-07-28 ENCOUNTER — Ambulatory Visit
Admission: RE | Admit: 2014-07-28 | Discharge: 2014-07-28 | Disposition: A | Discharge status: Home / Self Care | Payer: 59 | Source: Ambulatory Visit | Attending: Gynecology | Admitting: Gynecology

## 2014-07-28 DIAGNOSIS — N6315 Unspecified lump in the right breast, overlapping quadrants: Secondary | ICD-10-CM

## 2014-07-28 DIAGNOSIS — N6310 Unspecified lump in the right breast, unspecified quadrant: Principal | ICD-10-CM

## 2017-04-08 ENCOUNTER — Other Ambulatory Visit (HOSPITAL_COMMUNITY): Payer: Self-pay | Admitting: Obstetrics & Gynecology

## 2017-04-08 DIAGNOSIS — Z3141 Encounter for fertility testing: Secondary | ICD-10-CM

## 2017-04-10 ENCOUNTER — Encounter (HOSPITAL_COMMUNITY): Payer: Self-pay | Admitting: Radiology

## 2017-04-10 ENCOUNTER — Ambulatory Visit (HOSPITAL_COMMUNITY)
Admission: RE | Admit: 2017-04-10 | Discharge: 2017-04-10 | Disposition: A | Discharge status: Home / Self Care | Payer: BLUE CROSS/BLUE SHIELD | Source: Ambulatory Visit | Attending: Obstetrics & Gynecology | Admitting: Obstetrics & Gynecology

## 2017-04-10 DIAGNOSIS — Z3141 Encounter for fertility testing: Secondary | ICD-10-CM | POA: Diagnosis not present

## 2017-04-10 MED ORDER — IOPAMIDOL (ISOVUE-300) INJECTION 61%
30.0000 mL | Freq: Once | INTRAVENOUS | Status: AC | PRN
  Administered 2017-04-10: 5 mL

## 2017-11-05 ENCOUNTER — Other Ambulatory Visit: Payer: Self-pay | Admitting: Gastroenterology

## 2017-11-05 DIAGNOSIS — R112 Nausea with vomiting, unspecified: Secondary | ICD-10-CM

## 2017-11-19 ENCOUNTER — Ambulatory Visit
Admission: RE | Admit: 2017-11-19 | Discharge: 2017-11-19 | Disposition: A | Discharge status: Home / Self Care | Payer: No Typology Code available for payment source | Source: Ambulatory Visit | Attending: Gastroenterology | Admitting: Gastroenterology

## 2017-11-19 DIAGNOSIS — R112 Nausea with vomiting, unspecified: Secondary | ICD-10-CM

## 2017-11-19 MED ORDER — IOPAMIDOL (ISOVUE-300) INJECTION 61%
100.0000 mL | Freq: Once | INTRAVENOUS | Status: AC | PRN
  Administered 2017-11-19: 100 mL via INTRAVENOUS

## 2017-11-20 ENCOUNTER — Encounter (HOSPITAL_BASED_OUTPATIENT_CLINIC_OR_DEPARTMENT_OTHER): Payer: Self-pay | Admitting: *Deleted

## 2017-11-20 ENCOUNTER — Other Ambulatory Visit: Payer: Self-pay

## 2017-11-20 NOTE — Progress Notes (Addendum)
SPOKE WITH Kelci NPO AFTER MIDNIGHT ARRIVE 530 AM 11-30-17 [facility] MEDS TO TAKE: ALBUTEROL INHALER PRN, FLONASE NASAL SPRAY, DULERA INHALER, BEPREVE EYE DROP, ASTELINE NASAL SPRAY HAS PRE OP ORDERS NEEDS  URINE PREGNANCY LAB APPOINTMENT 11-25-17 200PM LABS TO BE DONE [facility],  CMET DRIVER SPOUSE JAMES WILL STAY FOR SURGERY

## 2017-11-25 ENCOUNTER — Encounter (HOSPITAL_COMMUNITY)
Admission: RE | Admit: 2017-11-25 | Discharge: 2017-11-25 | Disposition: A | Discharge status: Home / Self Care | Payer: 59 | Source: Ambulatory Visit | Attending: Obstetrics & Gynecology | Admitting: Obstetrics & Gynecology

## 2017-11-25 DIAGNOSIS — Z01812 Encounter for preprocedural laboratory examination: Secondary | ICD-10-CM | POA: Diagnosis present

## 2017-11-25 LAB — CBC
HEMATOCRIT: 40.2 % (ref 36.0–46.0)
Hemoglobin: 13.8 g/dL (ref 12.0–15.0)
MCH: 29.6 pg (ref 26.0–34.0)
MCHC: 34.3 g/dL (ref 30.0–36.0)
MCV: 86.3 fL (ref 78.0–100.0)
Platelets: 311 10*3/uL (ref 150–400)
RBC: 4.66 MIL/uL (ref 3.87–5.11)
RDW: 13.4 % (ref 11.5–15.5)
WBC: 8.3 10*3/uL (ref 4.0–10.5)

## 2017-11-25 LAB — COMPREHENSIVE METABOLIC PANEL
ALK PHOS: 44 U/L (ref 38–126)
ALT: 20 U/L (ref 14–54)
AST: 17 U/L (ref 15–41)
Albumin: 3.7 g/dL (ref 3.5–5.0)
Anion gap: 8 (ref 5–15)
BILIRUBIN TOTAL: 0.6 mg/dL (ref 0.3–1.2)
BUN: 11 mg/dL (ref 6–20)
CO2: 23 mmol/L (ref 22–32)
Calcium: 8.6 mg/dL — ABNORMAL LOW (ref 8.9–10.3)
Chloride: 105 mmol/L (ref 101–111)
Creatinine, Ser: 0.65 mg/dL (ref 0.44–1.00)
GFR calc Af Amer: >60 mL/min (ref >60)
Glucose, Bld: 103 mg/dL — ABNORMAL HIGH (ref 65–99)
Potassium: 3.9 mmol/L (ref 3.5–5.1)
Sodium: 136 mmol/L (ref 135–145)
TOTAL PROTEIN: 6.8 g/dL (ref 6.5–8.1)

## 2017-11-29 NOTE — Anesthesia Preprocedure Evaluation (Addendum)
Anesthesia Evaluation  Patient identified by MRN, date of birth, ID band Patient awake    Reviewed: Allergy & Precautions, NPO status , Patient's Chart, lab work & pertinent test results  History of Anesthesia Complications (+) PONV  Airway Mallampati: II  TM Distance: >3 FB Neck ROM: Full    Dental no notable dental hx.    Pulmonary asthma ,    Pulmonary exam normal breath sounds clear to auscultation       Cardiovascular negative cardio ROS Normal cardiovascular exam Rhythm:Regular Rate:Normal     Neuro/Psych  Headaches, Anxiety    GI/Hepatic negative GI ROS, GERD  Medicated,  Endo/Other    Renal/GU      Musculoskeletal  (+) Arthritis ,   Abdominal (+) + obese,   Peds  Hematology   Anesthesia Other Findings   Reproductive/Obstetrics                           Lab Results  Component Value Date   WBC 8.3 11/25/2017   HGB 13.8 11/25/2017   HCT 40.2 11/25/2017   MCV 86.3 11/25/2017   PLT 311 11/25/2017    Anesthesia Physical Anesthesia Plan  ASA: II  Anesthesia Plan: General   Post-op Pain Management:    Induction: Intravenous  PONV Risk Score and Plan: 3 and Treatment may vary due to age or medical condition, Ondansetron and Scopolamine patch - Pre-op  Airway Management Planned: Oral ETT  Additional Equipment:   Intra-op Plan:   Post-operative Plan: Extubation in OR  Informed Consent: I have reviewed the patients History and Physical, chart, labs and discussed the procedure including the risks, benefits and alternatives for the proposed anesthesia with the patient or authorized representative who has indicated his/her understanding and acceptance.   Dental advisory given  Plan Discussed with: CRNA  Anesthesia Plan Comments:         Anesthesia Quick Evaluation

## 2017-11-29 NOTE — H&P (Signed)
Tara Castaneda is an 32 y.o. female with heavy menstrual bleeding and chronic pelvic pain; worse with period.  The patient discontinued Depo Provera > 3 years ago and has recently been trying to conceive.  Secondary to patient's complaints, u/s was performed which showed normal gyn anatomy with exception of 7mm endometrial polyp.  NSAIDs no help in treating pain.  Because of concomitant GI complaints, the patient was evaluated by GI who did CT (wnl) and believed etiology of pain likely gyn.    Pertinent Gynecological History: Menses: flow is excessive with use of 7 pads or tampons on heaviest days Bleeding: regular Contraception: none DES exposure: denies Blood transfusions: none Sexually transmitted diseases: no past history Previous GYN Procedures: none  Last mammogram: n/a Date: n/a Last pap: normal Date: 2019 OB History: G0   Menstrual History: Menarche age: n/a Patient's last menstrual period was 11/03/2017 (exact date).    Past Medical History:  Diagnosis Date  . ADD (attention deficit disorder)   . Asthma    MILD - LAST FLARE UP Aug 30, 2017 OFFICE NOTE ON CHART  . DJD (degenerative joint disease)    L 4 TO L5 , ARTHRITIS RIGHT KNEE  . Eczema    MOSTLY ON HANDS - OCCAS PATCHES ELSWHERE  . GERD (gastroesophageal reflux disease)   . History of heartburn   . History of kidney stones   . KP (keratosis pilaris)    UPPER ARMS  . Migraines   . Pain    HX OF STRAINED BACK SINCE ACCIDENT IN CHILDHOOD   . PONV (postoperative nausea and vomiting)    SEVERE  . Scoliosis    SLIGHT  . Seasonal allergies    MOLD AND MILDEW    Past Surgical History:  Procedure Laterality Date  . CHOLECYSTECTOMY  DEC 2004  . CYSTOSCOPY WITH RETROGRADE PYELOGRAM, URETEROSCOPY AND STENT PLACEMENT Right 10/26/2013   Procedure: CYSTOSCOPY WITH RIGHT URETEROSCOPY,  AND RIGHT URETERAL STENT PLACEMENT,  LEFT CYSTOSCOPY WITH RETROGRADE;  Surgeon: Crist Fat, MD;  Location: [facility];  Service:  Urology;  Laterality: Right;  . EXOSTECTOMY  04/2014   BONE SPUR REMOVED  . LITHOTRIPSY  MAY 2011  . RIGHT KNEE ARTHROSCOPY     JAN 2003 AND MARCH 2004, AND DEC 2004 AND AUGUST 2008    History reviewed. No pertinent family history.  Social History:  reports that she has never smoked. She has never used smokeless tobacco. She reports that she drinks alcohol. She reports that she does not use drugs.  Allergies:  Allergies  Allergen Reactions  . Amoxicillin     RASH  . Lactose Intolerance (Gi)     STOMACH ISSUES  . Other     RETIN A = RASH  . Penicillins     RASH    No medications prior to admission.    ROS  Height 5\' 3"  (1.6 m), weight 195 lb (88.5 kg), last menstrual period 11/03/2017. Physical Exam  Constitutional: She is oriented to person, place, and time. She appears well-developed and well-nourished.  GI: Soft. There is no rebound.  Neurological: She is alert and oriented to person, place, and time.  Skin: Skin is warm and dry.    No results found for this or any previous visit (from the past 24 hour(s)).  No results found.  Assessment/Plan: 32 yo with chronic pelvic pain and endometrial polyp -Dx L/S, possible removal of endometriosis, hysteroscopy, D&C -Patient has been counseled re: risk of bleeding, infection, scarring, and damage  to surrounding structures.  All questions were answered and the patient wishes to proceed.  Carlisia Geno 11/29/2017, 10:02 PM

## 2017-11-30 ENCOUNTER — Encounter (HOSPITAL_BASED_OUTPATIENT_CLINIC_OR_DEPARTMENT_OTHER)
Admission: RE | Disposition: A | Discharge status: Home / Self Care | Payer: Self-pay | Source: Ambulatory Visit | Attending: Obstetrics & Gynecology

## 2017-11-30 ENCOUNTER — Ambulatory Visit (HOSPITAL_BASED_OUTPATIENT_CLINIC_OR_DEPARTMENT_OTHER): Payer: 59 | Admitting: Anesthesiology

## 2017-11-30 ENCOUNTER — Ambulatory Visit (HOSPITAL_BASED_OUTPATIENT_CLINIC_OR_DEPARTMENT_OTHER)
Admission: RE | Admit: 2017-11-30 | Discharge: 2017-11-30 | Disposition: A | Discharge status: Home / Self Care | Payer: 59 | Source: Ambulatory Visit | Attending: Obstetrics & Gynecology | Admitting: Obstetrics & Gynecology

## 2017-11-30 ENCOUNTER — Other Ambulatory Visit: Payer: Self-pay

## 2017-11-30 ENCOUNTER — Encounter (HOSPITAL_BASED_OUTPATIENT_CLINIC_OR_DEPARTMENT_OTHER): Payer: Self-pay | Admitting: *Deleted

## 2017-11-30 DIAGNOSIS — N840 Polyp of corpus uteri: Secondary | ICD-10-CM | POA: Insufficient documentation

## 2017-11-30 DIAGNOSIS — G8929 Other chronic pain: Secondary | ICD-10-CM | POA: Diagnosis not present

## 2017-11-30 DIAGNOSIS — K219 Gastro-esophageal reflux disease without esophagitis: Secondary | ICD-10-CM | POA: Diagnosis not present

## 2017-11-30 DIAGNOSIS — M1711 Unilateral primary osteoarthritis, right knee: Secondary | ICD-10-CM | POA: Insufficient documentation

## 2017-11-30 DIAGNOSIS — Z79899 Other long term (current) drug therapy: Secondary | ICD-10-CM | POA: Insufficient documentation

## 2017-11-30 DIAGNOSIS — F419 Anxiety disorder, unspecified: Secondary | ICD-10-CM | POA: Diagnosis not present

## 2017-11-30 DIAGNOSIS — J45909 Unspecified asthma, uncomplicated: Secondary | ICD-10-CM | POA: Insufficient documentation

## 2017-11-30 DIAGNOSIS — N803 Endometriosis of pelvic peritoneum: Secondary | ICD-10-CM | POA: Diagnosis not present

## 2017-11-30 DIAGNOSIS — R102 Pelvic and perineal pain: Secondary | ICD-10-CM

## 2017-11-30 HISTORY — PX: HYSTEROSCOPY WITH D & C: SHX1775

## 2017-11-30 HISTORY — DX: Gastro-esophageal reflux disease without esophagitis: K21.9

## 2017-11-30 HISTORY — DX: Unspecified osteoarthritis, unspecified site: M19.90

## 2017-11-30 HISTORY — PX: LAPAROSCOPY: SHX197

## 2017-11-30 LAB — POCT PREGNANCY, URINE: Preg Test, Ur: NEGATIVE

## 2017-11-30 SURGERY — LAPAROSCOPY, DIAGNOSTIC
Anesthesia: General

## 2017-11-30 MED ORDER — SCOPOLAMINE 1 MG/3DAYS TD PT72
1.0000 | MEDICATED_PATCH | TRANSDERMAL | Status: DC
  Administered 2017-11-30: 1.5 mg via TRANSDERMAL
  Filled 2017-11-30: qty 1

## 2017-11-30 MED ORDER — CLONIDINE HCL 0.2 MG PO TABS
0.2000 mg | ORAL_TABLET | Freq: Once | ORAL | Status: AC
  Administered 2017-11-30: 0.2 mg via ORAL
  Filled 2017-11-30: qty 1

## 2017-11-30 MED ORDER — IBUPROFEN 800 MG PO TABS: 800.0000 mg | ORAL_TABLET | Freq: Four times a day (QID) | ORAL | 0 refills | Status: AC | PRN

## 2017-11-30 MED ORDER — DEXAMETHASONE SODIUM PHOSPHATE 10 MG/ML IJ SOLN
INTRAMUSCULAR | Status: AC
  Filled 2017-11-30: qty 1

## 2017-11-30 MED ORDER — PHENYLEPHRINE HCL 10 MG/ML IJ SOLN
INTRAMUSCULAR | Status: DC | PRN
  Administered 2017-11-30: 80 ug via INTRAVENOUS

## 2017-11-30 MED ORDER — MIDAZOLAM HCL 2 MG/2ML IJ SOLN
INTRAMUSCULAR | Status: DC | PRN
  Administered 2017-11-30: 2 mg via INTRAVENOUS

## 2017-11-30 MED ORDER — OXYCODONE-ACETAMINOPHEN 5-325 MG PO TABS: 1.0000 | ORAL_TABLET | ORAL | 0 refills | Status: DC | PRN

## 2017-11-30 MED ORDER — ROCURONIUM BROMIDE 10 MG/ML (PF) SYRINGE
PREFILLED_SYRINGE | INTRAVENOUS | Status: DC | PRN
  Administered 2017-11-30: 50 mg via INTRAVENOUS
  Administered 2017-11-30: 10 mg via INTRAVENOUS

## 2017-11-30 MED ORDER — HYDROCODONE-ACETAMINOPHEN 7.5-325 MG PO TABS
ORAL_TABLET | ORAL | Status: AC
  Filled 2017-11-30: qty 1

## 2017-11-30 MED ORDER — HYDROMORPHONE HCL 1 MG/ML IJ SOLN
0.2500 mg | INTRAMUSCULAR | Status: DC | PRN
  Filled 2017-11-30: qty 0.5

## 2017-11-30 MED ORDER — PHENYLEPHRINE 40 MCG/ML (10ML) SYRINGE FOR IV PUSH (FOR BLOOD PRESSURE SUPPORT)
PREFILLED_SYRINGE | INTRAVENOUS | Status: AC
  Filled 2017-11-30: qty 10

## 2017-11-30 MED ORDER — ONDANSETRON HCL 4 MG/2ML IJ SOLN
INTRAMUSCULAR | Status: DC | PRN
  Administered 2017-11-30: 4 mg via INTRAVENOUS

## 2017-11-30 MED ORDER — HYDROCODONE-ACETAMINOPHEN 7.5-325 MG PO TABS
1.0000 | ORAL_TABLET | Freq: Once | ORAL | Status: AC | PRN
  Administered 2017-11-30: 1 via ORAL
  Filled 2017-11-30: qty 1

## 2017-11-30 MED ORDER — DEXAMETHASONE SODIUM PHOSPHATE 10 MG/ML IJ SOLN
INTRAMUSCULAR | Status: DC | PRN
  Administered 2017-11-30: 10 mg via INTRAVENOUS

## 2017-11-30 MED ORDER — LACTATED RINGERS IV SOLN
INTRAVENOUS | Status: DC
  Administered 2017-11-30 (×2): via INTRAVENOUS
  Filled 2017-11-30: qty 1000

## 2017-11-30 MED ORDER — KETOROLAC TROMETHAMINE 30 MG/ML IJ SOLN
INTRAMUSCULAR | Status: AC
  Filled 2017-11-30: qty 1

## 2017-11-30 MED ORDER — GABAPENTIN 300 MG PO CAPS
ORAL_CAPSULE | ORAL | Status: AC
  Filled 2017-11-30: qty 1

## 2017-11-30 MED ORDER — ROCURONIUM BROMIDE 10 MG/ML (PF) SYRINGE
PREFILLED_SYRINGE | INTRAVENOUS | Status: AC
  Filled 2017-11-30: qty 5

## 2017-11-30 MED ORDER — LACTATED RINGERS IV SOLN
INTRAVENOUS | Status: DC
  Filled 2017-11-30: qty 1000

## 2017-11-30 MED ORDER — CLONIDINE HCL 0.2 MG PO TABS
ORAL_TABLET | ORAL | Status: AC
  Filled 2017-11-30: qty 1

## 2017-11-30 MED ORDER — ACETAMINOPHEN 325 MG PO TABS
ORAL_TABLET | ORAL | Status: AC
  Filled 2017-11-30: qty 2

## 2017-11-30 MED ORDER — SUGAMMADEX SODIUM 200 MG/2ML IV SOLN
INTRAVENOUS | Status: AC
  Filled 2017-11-30: qty 2

## 2017-11-30 MED ORDER — PROPOFOL 10 MG/ML IV BOLUS
INTRAVENOUS | Status: AC
  Filled 2017-11-30: qty 40

## 2017-11-30 MED ORDER — ACETAMINOPHEN 325 MG PO TABS
650.0000 mg | ORAL_TABLET | Freq: Once | ORAL | Status: AC
  Administered 2017-11-30: 650 mg via ORAL
  Filled 2017-11-30: qty 2

## 2017-11-30 MED ORDER — ARTIFICIAL TEARS OPHTHALMIC OINT
TOPICAL_OINTMENT | OPHTHALMIC | Status: AC
  Filled 2017-11-30: qty 3.5

## 2017-11-30 MED ORDER — MEPERIDINE HCL 25 MG/ML IJ SOLN
6.2500 mg | INTRAMUSCULAR | Status: DC | PRN
  Filled 2017-11-30: qty 1

## 2017-11-30 MED ORDER — PROMETHAZINE HCL 25 MG/ML IJ SOLN
6.2500 mg | INTRAMUSCULAR | Status: DC | PRN
  Filled 2017-11-30: qty 1

## 2017-11-30 MED ORDER — LIDOCAINE 2% (20 MG/ML) 5 ML SYRINGE
INTRAMUSCULAR | Status: DC | PRN
  Administered 2017-11-30: 100 mg via INTRAVENOUS

## 2017-11-30 MED ORDER — SCOPOLAMINE 1 MG/3DAYS TD PT72
MEDICATED_PATCH | TRANSDERMAL | Status: AC
  Filled 2017-11-30: qty 1

## 2017-11-30 MED ORDER — ACETAMINOPHEN 10 MG/ML IV SOLN
1000.0000 mg | Freq: Once | INTRAVENOUS | Status: DC | PRN
  Filled 2017-11-30: qty 100

## 2017-11-30 MED ORDER — LIDOCAINE 2% (20 MG/ML) 5 ML SYRINGE
INTRAMUSCULAR | Status: AC
  Filled 2017-11-30: qty 5

## 2017-11-30 MED ORDER — FENTANYL CITRATE (PF) 100 MCG/2ML IJ SOLN
INTRAMUSCULAR | Status: DC | PRN
  Administered 2017-11-30: 50 ug via INTRAVENOUS
  Administered 2017-11-30: 100 ug via INTRAVENOUS
  Administered 2017-11-30 (×2): 50 ug via INTRAVENOUS

## 2017-11-30 MED ORDER — MIDAZOLAM HCL 2 MG/2ML IJ SOLN
INTRAMUSCULAR | Status: AC
  Filled 2017-11-30: qty 2

## 2017-11-30 MED ORDER — SUGAMMADEX SODIUM 200 MG/2ML IV SOLN
INTRAVENOUS | Status: DC | PRN
  Administered 2017-11-30: 200 mg via INTRAVENOUS

## 2017-11-30 MED ORDER — ONDANSETRON HCL 4 MG/2ML IJ SOLN
INTRAMUSCULAR | Status: AC
Start: 2017-11-30 — End: ?
  Filled 2017-11-30: qty 2

## 2017-11-30 MED ORDER — GABAPENTIN 300 MG PO CAPS
300.0000 mg | ORAL_CAPSULE | Freq: Once | ORAL | Status: AC
  Administered 2017-11-30: 300 mg via ORAL
  Filled 2017-11-30: qty 1

## 2017-11-30 MED ORDER — ONDANSETRON HCL 4 MG/2ML IJ SOLN
INTRAMUSCULAR | Status: AC
  Filled 2017-11-30: qty 2

## 2017-11-30 MED ORDER — KETOROLAC TROMETHAMINE 30 MG/ML IJ SOLN
INTRAMUSCULAR | Status: DC | PRN
  Administered 2017-11-30: 30 mg via INTRAVENOUS

## 2017-11-30 MED ORDER — FENTANYL CITRATE (PF) 250 MCG/5ML IJ SOLN
INTRAMUSCULAR | Status: AC
  Filled 2017-11-30: qty 5

## 2017-11-30 MED ORDER — PROPOFOL 10 MG/ML IV BOLUS
INTRAVENOUS | Status: DC | PRN
  Administered 2017-11-30: 160 mg via INTRAVENOUS

## 2017-11-30 MED ORDER — BUPIVACAINE HCL (PF) 0.25 % IJ SOLN
INTRAMUSCULAR | Status: DC | PRN
  Administered 2017-11-30: 5 mL

## 2017-11-30 SURGICAL SUPPLY — 67 items
APPLICATOR COTTON TIP 6IN STRL (MISCELLANEOUS) IMPLANT
BAG RETRIEVAL 10MM (BASKET)
BANDAGE STRIP 1X3 FLEXIBLE (GAUZE/BANDAGES/DRESSINGS) ×3 IMPLANT
BARRIER ADHS 3X4 INTERCEED (GAUZE/BANDAGES/DRESSINGS) IMPLANT
BENZOIN TINCTURE PRP APPL 2/3 (GAUZE/BANDAGES/DRESSINGS) IMPLANT
BIPOLAR CUTTING LOOP 21FR (ELECTRODE)
CANISTER SUCT 3000ML PPV (MISCELLANEOUS) ×3 IMPLANT
CANISTER SUCTION 1200CC (MISCELLANEOUS) IMPLANT
CATH ROBINSON RED A/P 14FR (CATHETERS) ×3 IMPLANT
CATH ROBINSON RED A/P 16FR (CATHETERS) ×3 IMPLANT
CLOSURE WOUND 1/2 X4 (GAUZE/BANDAGES/DRESSINGS)
CLOSURE WOUND 1/4X4 (GAUZE/BANDAGES/DRESSINGS)
CLOTH BEACON ORANGE TIMEOUT ST (SAFETY) ×3 IMPLANT
COUNTER NEEDLE 1200 MAGNETIC (NEEDLE) ×3 IMPLANT
COVER MAYO STAND STRL (DRAPES) ×3 IMPLANT
DERMABOND ADVANCED (GAUZE/BANDAGES/DRESSINGS)
DERMABOND ADVANCED .7 DNX12 (GAUZE/BANDAGES/DRESSINGS) IMPLANT
DEVICE MYOSURE LITE (MISCELLANEOUS) IMPLANT
DEVICE MYOSURE REACH (MISCELLANEOUS) IMPLANT
DILATOR CANAL MILEX (MISCELLANEOUS) IMPLANT
DURAPREP 26ML APPLICATOR (WOUND CARE) ×3 IMPLANT
ELECT REM PT RETURN 9FT ADLT (ELECTROSURGICAL) ×3
ELECTRODE REM PT RTRN 9FT ADLT (ELECTROSURGICAL) ×1 IMPLANT
FILTER ARTHROSCOPY CONVERTOR (FILTER) ×3 IMPLANT
GLOVE BIO SURGEON STRL SZ 6 (GLOVE) ×6 IMPLANT
GLOVE BIOGEL PI IND STRL 6 (GLOVE) ×2 IMPLANT
GLOVE BIOGEL PI INDICATOR 6 (GLOVE) ×4
GLOVE SURG SS PI 6.0 STRL IVOR (GLOVE) ×6 IMPLANT
GOWN STRL REUS W/ TWL LRG LVL3 (GOWN DISPOSABLE) ×3 IMPLANT
GOWN STRL REUS W/TWL LRG LVL3 (GOWN DISPOSABLE) ×6
IV NS IRRIG 3000ML ARTHROMATIC (IV SOLUTION) ×3 IMPLANT
KIT TURNOVER CYSTO (KITS) ×3 IMPLANT
LIGASURE LAP L-HOOKWIRE 5 44CM (INSTRUMENTS) IMPLANT
LOOP CUTTING BIPOLAR 21FR (ELECTRODE) IMPLANT
MYOSURE XL FIBROID REM (MISCELLANEOUS)
NEEDLE INSUFFLATION 120MM (ENDOMECHANICALS) ×3 IMPLANT
NS IRRIG 500ML POUR BTL (IV SOLUTION) ×3 IMPLANT
PACK LAPAROSCOPY BASIN (CUSTOM PROCEDURE TRAY) ×3 IMPLANT
PACK TRENDGUARD 450 HYBRID PRO (MISCELLANEOUS) ×1 IMPLANT
PACK VAGINAL MINOR WOMEN LF (CUSTOM PROCEDURE TRAY) ×3 IMPLANT
PAD OB MATERNITY 4.3X12.25 (PERSONAL CARE ITEMS) ×3 IMPLANT
PENCIL BUTTON HOLSTER BLD 10FT (ELECTRODE) IMPLANT
SCISSORS LAP 5X35 DISP (ENDOMECHANICALS) IMPLANT
SEAL ROD LENS SCOPE MYOSURE (ABLATOR) ×3 IMPLANT
SEALER TISSUE G2 CVD JAW 45CM (ENDOMECHANICALS) IMPLANT
SET IRRIG TUBING LAPAROSCOPIC (IRRIGATION / IRRIGATOR) IMPLANT
STRIP CLOSURE SKIN 1/2X4 (GAUZE/BANDAGES/DRESSINGS) IMPLANT
STRIP CLOSURE SKIN 1/4X4 (GAUZE/BANDAGES/DRESSINGS) IMPLANT
SUT MNCRL AB 3-0 PS2 18 (SUTURE) ×3 IMPLANT
SUT VICRYL 0 UR6 27IN ABS (SUTURE) ×3 IMPLANT
SYR 20CC LL (SYRINGE) IMPLANT
SYRINGE 10CC LL (SYRINGE) ×3 IMPLANT
SYS BAG RETRIEVAL 10MM (BASKET)
SYSTEM BAG RETRIEVAL 10MM (BASKET) IMPLANT
SYSTEM TISS REMOVAL MYSR XL RM (MISCELLANEOUS) IMPLANT
TOWEL OR 17X24 6PK STRL BLUE (TOWEL DISPOSABLE) ×6 IMPLANT
TRENDGUARD 450 HYBRID PRO PACK (MISCELLANEOUS) ×3
TROCAR XCEL NON-BLD 11X100MML (ENDOMECHANICALS) ×3 IMPLANT
TROCAR XCEL NON-BLD 5MMX100MML (ENDOMECHANICALS) ×3 IMPLANT
TUBE CONNECTING 12'X1/4 (SUCTIONS)
TUBE CONNECTING 12X1/4 (SUCTIONS) IMPLANT
TUBING AQUILEX INFLOW (TUBING) ×3 IMPLANT
TUBING AQUILEX OUTFLOW (TUBING) ×3 IMPLANT
TUBING EXTENTION W/L.L. (IV SETS) IMPLANT
TUBING INSUF HEATED (TUBING) ×3 IMPLANT
WARMER LAPAROSCOPE (MISCELLANEOUS) ×3 IMPLANT
WATER STERILE IRR 500ML POUR (IV SOLUTION) ×3 IMPLANT

## 2017-11-30 NOTE — Discharge Instructions (Signed)
Call MD for T>100.4, heavy vaginal bleeding, severe abdominal pain, intractable nausea and/or vomiting, or respiratory distress.  Call office to schedule postop incision check in 2 weeks. Pelvic rest x 2 weeks.  No driving while taking narcotics.     D & C Home care Instructions:   Personal hygiene:  Used sanitary napkins for vaginal drainage not tampons. Shower or tub bathe the day after your procedure. No douching until bleeding stops. Always wipe from front to back after  Elimination.  Activity: Do not drive or operate any equipment today. The effects of the anesthesia are still present and drowsiness may result. Rest today, not necessarily flat bed rest, just take it easy. You may resume your normal activity in one to 2 days.  Sexual activity: No intercourse for one week or as indicated by your physician  Diet: Eat a light diet as desired this evening. You may resume a regular diet tomorrow.  Return to work: One to 2 days.  General Expectations of your surgery: Vaginal bleeding should be no heavier than a normal period. Spotting may continue up to 10 days. Mild cramps may continue for a couple of days. You may have a regular period in 2-6 weeks.  Unexpected observations call your doctor if these occur: persistent or heavy bleeding. Severe abdominal cramping or pain. Elevation of temperature greater than 100F.  Call for an appointment in one week.    Patient's Signature_______________________________________________________  Nurse's Signature________________________________________________________  DISCHARGE INSTRUCTIONS: Laparoscopy  The following instructions have been prepared to help you care for yourself upon your return home today.  Wound care:  Do not get the incision wet for the first 24 hours. The incision should be kept clean and dry.  The Band-Aids or dressings may be removed the day after surgery.  Should the incision become sore, red, and swollen after the  first week, check with your doctor.  Personal hygiene:  Shower the day after your procedure.  Activity and limitations:  Do NOT drive or operate any equipment today.  Do NOT lift anything more than 15 pounds for 2-3 weeks after surgery.  Do NOT rest in bed all day.  Walking is encouraged. Walk each day, starting slowly with 5-minute walks 3 or 4 times a day. Slowly increase the length of your walks.  Walk up and down stairs slowly.  Do NOT do strenuous activities, such as golfing, playing tennis, bowling, running, biking, weight lifting, gardening, mowing, or vacuuming for 2-4 weeks. Ask your doctor when it is okay to start.  Diet: Eat a light meal as desired this evening. You may resume your usual diet tomorrow.  Return to work: This is dependent on the type of work you do. For the most part you can return to a desk job within a week of surgery. If you are more active at work, please discuss this with your doctor.  What to expect after your surgery: You may have a slight burning sensation when you urinate on the first day. You may have a very small amount of blood in the urine. Expect to have a small amount of vaginal discharge/light bleeding for 1-2 weeks. It is not unusual to have abdominal soreness and bruising for up to 2 weeks. You may be tired and need more rest for about 1 week. You may experience shoulder pain for 24-72 hours. Lying flat in bed may relieve it.  Call your doctor for any of the following:  Develop a fever of 100.4 or greater  Inability  to urinate 6 hours after discharge from hospital  Severe pain not relieved by pain medications  Persistent of heavy bleeding at incision site  Redness or swelling around incision site after a week  Increasing nausea or vomiting  Patient Signature________________________________________ Nurse Signature_________________________________________  Post Anesthesia Home Care Instructions  Activity: Get plenty of rest for  the remainder of the day. A responsible individual must stay with you for 24 hours following the procedure.  For the next 24 hours, DO NOT: -Drive a car -Advertising copywriterperate machinery -Drink alcoholic beverages -Take any medication unless instructed by your physician -Make any legal decisions or sign important papers.  Meals: Start with liquid foods such as gelatin or soup. Progress to regular foods as tolerated. Avoid greasy, spicy, heavy foods. If nausea and/or vomiting occur, drink only clear liquids until the nausea and/or vomiting subsides. Call your physician if vomiting continues.  Special Instructions/Symptoms: Your throat may feel dry or sore from the anesthesia or the breathing tube placed in your throat during surgery. If this causes discomfort, gargle with warm salt water. The discomfort should disappear within 24 hours.  If you had a scopolamine patch placed behind your ear for the management of post- operative nausea and/or vomiting:  1. The medication in the patch is effective for 72 hours, after which it should be removed.  Wrap patch in a tissue and discard in the trash. Wash hands thoroughly with soap and water. 2. You may remove the patch earlier than 72 hours if you experience unpleasant side effects which may include dry mouth, dizziness or visual disturbances. 3. Avoid touching the patch. Wash your hands with soap and water after contact with the patch.

## 2017-11-30 NOTE — Op Note (Signed)
Tara BishopKimberly D Castaneda 11/30/2017  PREOPERATIVE DIAGNOSIS:  Chronic pelvic pain, endometrial polyp  POSTOPERATIVE DIAGNOSIS:  SAA  PROCEDURE:  Diagnostic laparoscopy, fulguration of endometriosis, hysteroscopy, D&C  SURGEON: Dr. Mitchel HonourMegan Syriana Croslin  PATHOLOGY: peritoneal biopsy, endometrial curettings  ANESTHESIA:  General endotracheal  COMPLICATIONS:  None immediate.  ESTIMATED BLOOD LOSS:  Less than 20 ml.  INDICATIONS: 32 y.o. with chronic pelvic pain and endometrial polyp     FINDINGS:  Normal uterus, normal bilateral fallopian tubes and ovaries.  Normal liver edge and appendix.  Clear vesicular lesions on bladder peritoneum; left greater than left.  Single gunpowder lesion on left uterosacral ligament. Small endometrial polyp on right side of cavity.  TECHNIQUE:  The patient was taken to the operating room where general anesthesia was obtained without difficulty.  She was then placed in the dorsal lithotomy position and prepared and draped in sterile fashion.  After an adequate timeout was performed, a bivalved speculum was then placed in the patient's vagina, and the anterior lip of cervix grasped with the single-tooth tenaculum.  The hulka clip was advanced into the uterus.  The speculum was removed from the vagina.  Attention was then turned to the patient's abdomen where a 10-mm skin incision was made on the umbilical fold.  The Veress needle was carefully introduced into the peritoneal cavity through the abdominal wall.  Intraperitoneal placement was confirmed by drop in intraabdominal pressure with insufflation of carbon dioxide gas.  Adequate pneumoperitoneum was obtained, and the 11mm trocar was then advanced without difficulty into the abdomen where intraabdominal placement was confirmed by the operative laparoscope.  A second 5-mm skin incision was made in the suprapubic region.  5 mm trocar was advanced under direct visualization without difficulty.  The above dictated findings were  noted.  Using a rat toothed grasper was used to biopsy the left uterosacral lesion.  Hemostasis was visualized.  The clear lesions on the bladder peritoneum were fulgurated using monopolar shears. All instruments were removed from the abdomen.  Infraumbilical fascia was closed using 0 vicryl in a figure of eights stitch.  Skin incisions were closed with 3-0 monocryl in a subcuticular fashion.     Attention was turned to the vaginal portion of the case.  The uterine manipulator was removed from the vagina without complications. A speculum was then placed in the patient's vagina and a single tooth tenaculum was applied to the anterior lip of the cervix.  The uteus was sounded to 7/5 cm and dilated manually with metal dilators to accommodate the 5.5 mm diagnostic hysteroscope.  Once the cervix was dilated, the hysteroscope was inserted under direct visualization using saline as a suspension medium.  The uterine cavity was carefully examined, both ostia were recognized. A single endometrial polyp was noted on the right side of the cavity.  After further careful visualization of the uterine cavity, the hysteroscope was removed under direct visualization.  A sharp curettage was then performed to obtain a small amount of endometrial curettings.  The tenaculum was removed from the anterior lip of the cervix and the vaginal speculum was removed after noting good hemostasis.  Sponge, lap and needle counts were correct x 2. The patient tolerated the procedure well and was taken to the recovery area awake, extubated and in stable condition.

## 2017-11-30 NOTE — Progress Notes (Signed)
No change to H&P.  Ilani Otterson, DO 

## 2017-11-30 NOTE — Transfer of Care (Signed)
Immediate Anesthesia Transfer of Care Note  Patient: Lyman BishopKimberly D Rickels  Procedure(s) Performed: LAPAROSCOPY DIAGNOSTIC possible removal of endometriosis (N/A ) DILATATION AND CURETTAGE /HYSTEROSCOPY (N/A )  Patient Location: PACU  Anesthesia Type:General  Level of Consciousness: awake, alert , oriented and patient cooperative  Airway & Oxygen Therapy: Patient Spontanous Breathing and Patient connected to nasal cannula oxygen  Post-op Assessment: Report given to RN and Post -op Vital signs reviewed and stable  Post vital signs: Reviewed and stable  Last Vitals:  Vitals Value Taken Time  BP    Temp    Pulse    Resp    SpO2      Last Pain:  Vitals:   11/30/17 0539  TempSrc: Oral      Patients Stated Pain Goal: 8 (11/30/17 0626)  Complications: No apparent anesthesia complications

## 2017-11-30 NOTE — Anesthesia Procedure Notes (Signed)
Procedure Name: Intubation Date/Time: 11/30/2017 7:31 AM Performed by: Wanita Chamberlain, CRNA Pre-anesthesia Checklist: Patient identified, Emergency Drugs available, Suction available, Patient being monitored and Timeout performed Patient Re-evaluated:Patient Re-evaluated prior to induction Oxygen Delivery Method: Circle system utilized Preoxygenation: Pre-oxygenation with 100% oxygen Induction Type: IV induction Ventilation: Mask ventilation without difficulty Laryngoscope Size: Mac and 3 Grade View: Grade I Tube type: Oral Tube size: 7.0 mm Number of attempts: 1 Airway Equipment and Method: Stylet Placement Confirmation: breath sounds checked- equal and bilateral,  CO2 detector,  positive ETCO2 and ETT inserted through vocal cords under direct vision Secured at: 21 cm Tube secured with: Tape Dental Injury: Teeth and Oropharynx as per pre-operative assessment

## 2017-12-01 ENCOUNTER — Encounter (HOSPITAL_BASED_OUTPATIENT_CLINIC_OR_DEPARTMENT_OTHER): Payer: Self-pay | Admitting: Obstetrics & Gynecology

## 2017-12-01 NOTE — Anesthesia Postprocedure Evaluation (Signed)
Anesthesia Post Note  Patient: Tara Castaneda  Procedure(s) Performed: LAPAROSCOPY DIAGNOSTIC possible removal of endometriosis (N/A ) DILATATION AND CURETTAGE /HYSTEROSCOPY (N/A )     Patient location during evaluation: PACU Anesthesia Type: General Level of consciousness: awake and alert Pain management: pain level controlled Vital Signs Assessment: post-procedure vital signs reviewed and stable Respiratory status: spontaneous breathing, nonlabored ventilation, respiratory function stable and patient connected to nasal cannula oxygen Cardiovascular status: blood pressure returned to baseline and stable Postop Assessment: no apparent nausea or vomiting Anesthetic complications: no    Last Vitals:  Vitals:   11/30/17 0930 11/30/17 1057  BP: 116/65 (!) 112/57  Pulse: 92 92  Resp: (!) 25 14  Temp:  36.7 C  SpO2: 96% 99%    Last Pain:  Vitals:   12/01/17 0928  TempSrc:   PainSc: 2                  Trevor IhaStephen A Carmon Brigandi

## 2018-05-31 ENCOUNTER — Emergency Department (HOSPITAL_BASED_OUTPATIENT_CLINIC_OR_DEPARTMENT_OTHER)
Admission: EM | Admit: 2018-05-31 | Discharge: 2018-05-31 | Disposition: A | Discharge status: Home / Self Care | Payer: BC Managed Care – PPO | Attending: Emergency Medicine | Admitting: Emergency Medicine

## 2018-05-31 ENCOUNTER — Encounter (HOSPITAL_BASED_OUTPATIENT_CLINIC_OR_DEPARTMENT_OTHER): Payer: Self-pay | Admitting: *Deleted

## 2018-05-31 ENCOUNTER — Other Ambulatory Visit: Payer: Self-pay

## 2018-05-31 DIAGNOSIS — R112 Nausea with vomiting, unspecified: Secondary | ICD-10-CM | POA: Diagnosis present

## 2018-05-31 DIAGNOSIS — Z79899 Other long term (current) drug therapy: Secondary | ICD-10-CM | POA: Insufficient documentation

## 2018-05-31 DIAGNOSIS — N809 Endometriosis, unspecified: Secondary | ICD-10-CM | POA: Diagnosis not present

## 2018-05-31 DIAGNOSIS — J45909 Unspecified asthma, uncomplicated: Secondary | ICD-10-CM | POA: Insufficient documentation

## 2018-05-31 LAB — URINALYSIS, ROUTINE W REFLEX MICROSCOPIC
GLUCOSE, UA: NEGATIVE mg/dL
Ketones, ur: >80 mg/dL — AB
Leukocytes, UA: NEGATIVE
Nitrite: NEGATIVE
PH: 6 (ref 5.0–8.0)
Protein, ur: NEGATIVE mg/dL
SPECIFIC GRAVITY, URINE: 1.025 (ref 1.005–1.030)

## 2018-05-31 LAB — COMPREHENSIVE METABOLIC PANEL
ALBUMIN: 4.4 g/dL (ref 3.5–5.0)
ALT: 41 U/L (ref 0–44)
ANION GAP: 10 (ref 5–15)
AST: 26 U/L (ref 15–41)
Alkaline Phosphatase: 48 U/L (ref 38–126)
BUN: 10 mg/dL (ref 6–20)
CHLORIDE: 105 mmol/L (ref 98–111)
CO2: 22 mmol/L (ref 22–32)
Calcium: 9.1 mg/dL (ref 8.9–10.3)
Creatinine, Ser: 0.82 mg/dL (ref 0.44–1.00)
GFR calc Af Amer: >60 mL/min (ref >60)
GFR calc non Af Amer: >60 mL/min (ref >60)
GLUCOSE: 89 mg/dL (ref 70–99)
POTASSIUM: 3.3 mmol/L — AB (ref 3.5–5.1)
SODIUM: 137 mmol/L (ref 135–145)
Total Bilirubin: 0.8 mg/dL (ref 0.3–1.2)
Total Protein: 7.4 g/dL (ref 6.5–8.1)

## 2018-05-31 LAB — URINALYSIS, MICROSCOPIC (REFLEX)

## 2018-05-31 LAB — CBC
HEMATOCRIT: 44.8 % (ref 36.0–46.0)
HEMOGLOBIN: 15.8 g/dL — AB (ref 12.0–15.0)
MCH: 30.2 pg (ref 26.0–34.0)
MCHC: 35.3 g/dL (ref 30.0–36.0)
MCV: 85.5 fL (ref 78.0–100.0)
Platelets: 329 10*3/uL (ref 150–400)
RBC: 5.24 MIL/uL — ABNORMAL HIGH (ref 3.87–5.11)
RDW: 14.2 % (ref 11.5–15.5)
WBC: 8.3 10*3/uL (ref 4.0–10.5)

## 2018-05-31 LAB — LIPASE, BLOOD: LIPASE: 31 U/L (ref 11–51)

## 2018-05-31 MED ORDER — SODIUM CHLORIDE 0.9 % IV BOLUS
1000.0000 mL | Freq: Once | INTRAVENOUS | Status: AC
  Administered 2018-05-31: 1000 mL via INTRAVENOUS

## 2018-05-31 MED ORDER — PROMETHAZINE HCL 25 MG PO TABS: 25.0000 mg | ORAL_TABLET | Freq: Four times a day (QID) | ORAL | 0 refills | Status: DC | PRN

## 2018-05-31 MED ORDER — PROMETHAZINE HCL 25 MG/ML IJ SOLN
25.0000 mg | Freq: Once | INTRAMUSCULAR | Status: AC
  Administered 2018-05-31: 25 mg via INTRAVENOUS
  Filled 2018-05-31: qty 1

## 2018-05-31 NOTE — ED Notes (Signed)
Pt verbalizes understanding of d/c instructions and denies any further needs at this time. 

## 2018-05-31 NOTE — ED Notes (Signed)
Sprite given to patient for PO challenge

## 2018-05-31 NOTE — ED Triage Notes (Addendum)
Vomiting since yesterday. She feels dehydrated. Decreased urine output today. [facility] and has her urine results with her.

## 2018-05-31 NOTE — ED Provider Notes (Signed)
[facility] EMERGENCY DEPARTMENT Provider Note   CSN: [id number] Arrival date & time: 05/31/18  1814     History   Chief Complaint Chief Complaint  Patient presents with  . Emesis    HPI Tara Castaneda is a 32 y.o. female.  She is complaining of nausea and vomiting multiple episodes since yesterday.  She has chronic endometriosis and started her period recently and that causes her flareups.  With this low abdominal pain started to make her nauseous and vomit.  She usually does not have the vomiting with her endometriosis symptoms.  No fever.  She went to urgent care where they found her to be quite dehydrated and recommended that she come to the emergency department for further evaluation.  The history is provided by the patient.  Emesis   This is a new problem. The current episode started yesterday. The problem occurs more than 10 times per day. The problem has not changed since onset.The emesis has an appearance of stomach contents. There has been no fever. Associated symptoms include abdominal pain. Pertinent negatives include no arthralgias, no chills, no cough, no diarrhea, no fever, no headaches, no myalgias, no sweats and no URI.    Past Medical History:  Diagnosis Date  . ADD (attention deficit disorder)   . Asthma    MILD - LAST FLARE UP Aug 30, 2017 OFFICE NOTE ON CHART  . DJD (degenerative joint disease)    L 4 TO L5 , ARTHRITIS RIGHT KNEE  . Eczema    MOSTLY ON HANDS - OCCAS PATCHES ELSWHERE  . GERD (gastroesophageal reflux disease)   . History of heartburn   . History of kidney stones   . KP (keratosis pilaris)    UPPER ARMS  . Migraines   . Pain    HX OF STRAINED BACK SINCE ACCIDENT IN CHILDHOOD   . PONV (postoperative nausea and vomiting)    SEVERE  . Scoliosis    SLIGHT  . Seasonal allergies    MOLD AND MILDEW    There are no active problems to display for this patient.   Past Surgical History:  Procedure Laterality Date  .  CHOLECYSTECTOMY  DEC 2004  . CYSTOSCOPY WITH RETROGRADE PYELOGRAM, URETEROSCOPY AND STENT PLACEMENT Right 10/26/2013   Procedure: CYSTOSCOPY WITH RIGHT URETEROSCOPY,  AND RIGHT URETERAL STENT PLACEMENT,  LEFT CYSTOSCOPY WITH RETROGRADE;  Surgeon: Crist Fat, MD;  Location: [facility];  Service: Urology;  Laterality: Right;  . EXOSTECTOMY  04/2014   BONE SPUR REMOVED  . HYSTEROSCOPY W/D&C N/A 11/30/2017   Procedure: DILATATION AND CURETTAGE /HYSTEROSCOPY;  Surgeon: Mitchel Honour, DO;  Location: [facility];  Service: Gynecology;  Laterality: N/A;  . LAPAROSCOPY N/A 11/30/2017   Procedure: LAPAROSCOPY DIAGNOSTIC possible removal of endometriosis;  Surgeon: Mitchel Honour, DO;  Location: [facility];  Service: Gynecology;  Laterality: N/A;  . LITHOTRIPSY  MAY 2011  . RIGHT KNEE ARTHROSCOPY     JAN 2003 AND MARCH 2004, AND DEC 2004 AND AUGUST 2008     OB History    Gravida  1   Para      Term      Preterm      AB      Living        SAB      TAB      Ectopic      Multiple      Live Births  Home Medications    Prior to Admission medications   Medication Sig Start Date End Date Taking? Authorizing Provider  albuterol (PROVENTIL HFA;VENTOLIN HFA) 108 (90 BASE) MCG/ACT inhaler Inhale 1 puff into the lungs every 6 (six) hours as needed for wheezing or shortness of breath.    [provider]  azelastine (ASTELIN) 0.1 % nasal spray Place 1 spray into both nostrils daily. Use in each nostril as directed    [provider]  fluticasone (FLONASE) 50 MCG/ACT nasal spray Place 1 spray into both nostrils daily.    [provider]  ibuprofen (ADVIL,MOTRIN) 800 MG tablet Take 1 tablet (800 mg total) by mouth every 6 (six) hours as needed. 11/30/17   Morris, Aundra Millet, DO  levocetirizine (XYZAL) 5 MG tablet Take 5 mg by mouth every evening.    [provider]  mometasone-formoterol (DULERA) 200-5 MCG/ACT AERO  Inhale 2 puffs into the lungs 2 (two) times daily.    [provider]  montelukast (SINGULAIR) 10 MG tablet Take 10 mg by mouth every morning.     [provider]  oxyCODONE-acetaminophen (PERCOCET) 5-325 MG tablet Take 1-2 tablets by mouth every 4 (four) hours as needed for severe pain. 11/30/17   Morris, Aundra Millet, DO  Potassium Citrate 15 MEQ (1620 MG) TBCR Take by mouth 2 (two) times daily.    [provider]  triamcinolone cream (KENALOG) 0.1 % Apply 1 application topically as needed.     [provider]  UNABLE TO FIND BEPREVE EYE DROP 1 DROP EACH EYE IN AM FOR ALLERGIES    [provider]  urea (CARMOL) 10 % cream Apply 1 application topically as needed.     [provider]    Family History No family history on file.  Social History Social History   Tobacco Use  . Smoking status: Never Smoker  . Smokeless tobacco: Never Used  Substance Use Topics  . Alcohol use: Yes    Comment: COUPLE OF GLASSES OF WINE OR COUPLE OF BEERS - PER WEEK  . Drug use: No     Allergies   Amoxicillin; Lactose intolerance (gi); Other; and Penicillins   Review of Systems Review of Systems  Constitutional: Negative for chills and fever.  HENT: Negative for sore throat.   Eyes: Negative for visual disturbance.  Respiratory: Negative for cough and shortness of breath.   Cardiovascular: Negative for chest pain.  Gastrointestinal: Positive for abdominal pain and vomiting. Negative for diarrhea.  Genitourinary: Negative for dysuria.  Musculoskeletal: Negative for arthralgias and myalgias.  Skin: Negative for rash.  Neurological: Negative for headaches.     Physical Exam Updated Vital Signs BP 120/66   Pulse 88   Temp 98.3 F (36.8 C) (Oral)   Resp 20   Ht 5\' 3"  (1.6 m)   Wt 83 kg   SpO2 100%   Breastfeeding? Unknown   BMI 32.42 kg/m   Physical Exam  Constitutional: She is oriented to person, place, and time. She appears well-developed  and well-nourished. No distress.  HENT:  Head: Normocephalic and atraumatic.  Eyes: Conjunctivae are normal.  Neck: Neck supple.  Cardiovascular: Normal rate and regular rhythm.  No murmur heard. Pulmonary/Chest: Effort normal and breath sounds normal. No respiratory distress.  Abdominal: Soft. She exhibits no mass. There is tenderness (suprapubic). There is no rebound.  Musculoskeletal: Normal range of motion. She exhibits no edema, tenderness or deformity.  Neurological: She is alert and oriented to person, place, and time. She has normal  strength. No sensory deficit. GCS eye subscore is 4. GCS verbal subscore is 5. GCS motor subscore is 6.  Skin: Skin is warm and dry. Capillary refill takes less than 2 seconds.  Psychiatric: She has a normal mood and affect.  Nursing note and vitals reviewed.    ED Treatments / Results  Labs (all labs ordered are listed, but only abnormal results are displayed) Labs Reviewed  COMPREHENSIVE METABOLIC PANEL - Abnormal; Notable for the following components:      Result Value   Potassium 3.3 (*)    All other components within normal limits  CBC - Abnormal; Notable for the following components:   RBC 5.24 (*)    Hemoglobin 15.8 (*)    All other components within normal limits  URINALYSIS, ROUTINE W REFLEX MICROSCOPIC - Abnormal; Notable for the following components:   Hgb urine dipstick LARGE (*)    Bilirubin Urine SMALL (*)    Ketones, ur >80 (*)    All other components within normal limits  URINALYSIS, MICROSCOPIC (REFLEX) - Abnormal; Notable for the following components:   Bacteria, UA RARE (*)    All other components within normal limits  LIPASE, BLOOD    EKG None  Radiology No results found.  Procedures Procedures (including critical care time)  Medications Ordered in ED Medications  sodium chloride 0.9 % bolus 1,000 mL (has no administration in time range)  promethazine (PHENERGAN) injection 25 mg (has no administration in time  range)     Initial Impression / Assessment and Plan / ED Course  I have reviewed the triage vital signs and the nursing notes.  Pertinent labs & imaging results that were available during my care of the patient were reviewed by me and considered in my medical decision making (see chart for details).  Clinical Course as of Jun 01 1128  Mon May 31, 2018  2142 Patient's had 1 L of IV fluids and has been able to urinate although she said it was pretty dark still.  She is only had a little bit of nausea now so we will give her another liter fluid and start p.o. trialing with some fluids.   [MB]  2218 Patient's lab work fairly unremarkable other than a mildly low potassium 3.3.  She had ketones in her urine and also some red cells but she said she is having her period.   [MB]    Clinical Course User Index [MB] Terrilee FilesButler, Berthe Oley C, MD     Final Clinical Impressions(s) / ED Diagnoses   Final diagnoses:  Non-intractable vomiting with nausea, unspecified vomiting type    ED Discharge Orders         Ordered    promethazine (PHENERGAN) 25 MG tablet  Every 6 hours PRN     05/31/18 2251           Terrilee FilesButler, Aryaman Haliburton C, MD 06/01/18 1130

## 2018-05-31 NOTE — Discharge Instructions (Signed)
You were evaluated in the emergency department for severe nausea and vomiting.  Your symptoms were improved with some IV fluids and nausea medication.  We are sending a prescription for some more nausea medicine to your pharmacy.  You should continue to try to stay well-hydrated with electrolyte containing solutions such as Pedialyte or sports drinks.  Advance diet as tolerated.  Follow-up with your doctor and return if any worsening symptoms.

## 2018-07-20 ENCOUNTER — Ambulatory Visit: Payer: BC Managed Care – PPO | Admitting: Physical Therapy

## 2018-07-21 ENCOUNTER — Other Ambulatory Visit: Payer: Self-pay

## 2018-07-21 ENCOUNTER — Encounter: Payer: Self-pay | Admitting: Physical Therapy

## 2018-07-21 ENCOUNTER — Ambulatory Visit: Payer: BC Managed Care – PPO | Attending: Obstetrics & Gynecology | Admitting: Physical Therapy

## 2018-07-21 DIAGNOSIS — M6281 Muscle weakness (generalized): Secondary | ICD-10-CM | POA: Diagnosis present

## 2018-07-21 DIAGNOSIS — R252 Cramp and spasm: Secondary | ICD-10-CM | POA: Insufficient documentation

## 2018-07-21 NOTE — Patient Instructions (Signed)
Moisturizers . They are used in the vagina to hydrate the mucous membrane that make up the vaginal canal. . Designed to keep a more normal acid balance (ph) . Once placed in the vagina, it will last between two to three days.  . Use 2-3 times per week at bedtime and last longer than 60 min. . Ingredients to avoid is glycerin and fragrance, can increase chance of infection . Should not be used just before sex due to causing irritation . Most are gels administered either in a tampon-shaped applicator or as a vaginal suppository. They are non-hormonal.   Types of Moisturizers . Luvena- drug store . Vitamin E vaginal suppositories- Whole foods, Amazon . Moist Again . Coconut oil- can break down condoms . Julva- (Do no use if on Tamoxifen) amazon . Yes moisturizer- amazon . NeuEve Silk , NeuEve Silver for menopausal or over 65 (if have severe vaginal atrophy or cancer treatments use NeuEve Silk for  1 month than move to NeuEve Silver)- Amazon, Neuve.com . Olive and Bee intimate cream- www.oliveandbee.com.au . Mae vaginal moisturizer- Amazon  Creams to use externally on the Vulva area  Desert Harvest Releveum (good for for cancer patients that had radiation to the area)- amazon or www.desertharvest.com  V-magic cream - amazon  Julva-amazon  Vital "V Wild Yam salve ( help moisturize and help with thinning vulvar area, does have Beeswax  MoodMaid Botanical Pro-Meno Wild Yam Cream- Amazon  Desert Harvest Gele  Cleo by Damiva labial moisturizer (Amazon,    Things to avoid in the vaginal area . Do not use things to irritate the vulvar area . No lotions just specialized creams for the vulva area- Neogyn, V-magic, No soaps; can use Aveeno or Calendula cleanser if needed. Must be gentle . No deodorants . No douches . Good to sleep without underwear to let the vaginal area to air out . No scrubbing: spread the lips to let warm water rinse over labias and pat dry [facility] [address], [address] [place], [place] [address] Phone # [phone] Fax [phone]  

## 2018-07-21 NOTE — Therapy (Signed)
[facility] [address] [place], [place], [address] Phone: [phone]   Fax:  [phone]  Physical Therapy Evaluation  Patient Details  Name: Tara Castaneda MRN: [id number] Date of Birth: 09-Jun-1986 Referring Provider (PT): Dr. Marlin Canary   Encounter Date: 07/21/2018  PT End of Session - 07/21/18 1624    Visit Number  1    Date for PT Re-Evaluation  10/13/18    PT Start Time  1530    PT Stop Time  1615    PT Time Calculation (min)  45 min    Activity Tolerance  Patient tolerated treatment well    Behavior During Therapy  [facility] for tasks assessed/performed       Past Medical History:  Diagnosis Date  . ADD (attention deficit disorder)   . Asthma    MILD - LAST FLARE UP Aug 30, 2017 OFFICE NOTE ON CHART  . DJD (degenerative joint disease)    L 4 TO L5 , ARTHRITIS RIGHT KNEE  . Eczema    MOSTLY ON HANDS - OCCAS PATCHES ELSWHERE  . GERD (gastroesophageal reflux disease)   . History of heartburn   . History of kidney stones   . KP (keratosis pilaris)    UPPER ARMS  . Migraines   . Pain    HX OF STRAINED BACK SINCE ACCIDENT IN CHILDHOOD   . PONV (postoperative nausea and vomiting)    SEVERE  . Scoliosis    SLIGHT  . Seasonal allergies    MOLD AND MILDEW    Past Surgical History:  Procedure Laterality Date  . CHOLECYSTECTOMY  DEC 2004  . CYSTOSCOPY WITH RETROGRADE PYELOGRAM, URETEROSCOPY AND STENT PLACEMENT Right 10/26/2013   Procedure: CYSTOSCOPY WITH RIGHT URETEROSCOPY,  AND RIGHT URETERAL STENT PLACEMENT,  LEFT CYSTOSCOPY WITH RETROGRADE;  Surgeon: Crist Fat, MD;  Location: [facility];  Service: Urology;  Laterality: Right;  . EXOSTECTOMY  04/2014   BONE SPUR REMOVED  . HYSTEROSCOPY W/D&C N/A 11/30/2017   Procedure: DILATATION AND CURETTAGE /HYSTEROSCOPY;  Surgeon: Mitchel Honour, DO;  Location: [facility];  Service: Gynecology;  Laterality: N/A;  . LAPAROSCOPY N/A  11/30/2017   Procedure: LAPAROSCOPY DIAGNOSTIC possible removal of endometriosis;  Surgeon: Mitchel Honour, DO;  Location: [facility];  Service: Gynecology;  Laterality: N/A;  . LITHOTRIPSY  MAY 2011  . RIGHT KNEE ARTHROSCOPY     JAN 2003 AND MARCH 2004, AND DEC 2004 AND AUGUST 2008    There were no vitals filed for this visit.   Subjective Assessment - 07/21/18 1537    Subjective  Patient reports pain 07/09/2017. Every month pain and cramps will last all month. #/25/2019 had laprarscopy and found endo and removed polyp. Patient started to have pain again by 02/06/2018. Patient had a IUD placed in and pain is getting progressively worse. Patient had a period the month of september  with IUD in.     Patient Stated Goals  reduce pain    Currently in Pain?  Yes    Pain Score  10-Worst pain ever    Pain Location  Abdomen    Pain Orientation  Lower    Pain Descriptors / Indicators  Radiating;Stabbing;Cramping    Pain Type  Chronic pain    Pain Radiating Towards  radiate to the vagina and the inner thighs    Pain Onset  More than a month ago    Pain Frequency  Constant    Aggravating Factors  standing, stress, sitting long period, walking, bleeding vaginally, after intercourse,     Pain Relieving Factors  siting may relieve it at times    Multiple Pain Sites  Yes    Pain Score  5    Pain Location  Back    Pain Orientation  Lower    Pain Descriptors / Indicators  Dull;Aching    Pain Type  Chronic pain    Pain Onset  More than a month ago    Pain Frequency  Intermittent    Aggravating Factors   increased abdominal pain, curled up to protect from abdominal pain, sitting on hard floor at work    Pain Relieving Factors  sit down, muscle relaxers         [facility] PT Assessment - 07/21/18 0001      Assessment   Medical Diagnosis  R10.2 Pelvic Pain    Referring Provider (PT)  Dr. Marlin Canary    Onset Date/Surgical Date  02/06/18    Prior Therapy  none      Precautions    Precautions  None      Restrictions   Weight Bearing Restrictions  No      Balance Screen   Has the patient fallen in the past 6 months  Yes    How many times?  1   at skating rink on school trip   Has the patient had a decrease in activity level because of a fear of falling?   No    Is the patient reluctant to leave their home because of a fear of falling?   No      Home Public house manager residence      Prior Function   Level of Independence  Independent    Vocation  Full time employment    Press photographer    Leisure  unable to exercise due to increase abdominal pain      Cognition   Overall Cognitive Status  Within Functional Limits for tasks assessed      Posture/Postural Control   Posture/Postural Control  Postural limitations    Postural Limitations  Rounded Shoulders;Forward head;Decreased lumbar lordosis      ROM / Strength   AROM / PROM / Strength  AROM;PROM;Strength      AROM   Lumbar Extension  decreased by 25%      Strength   Overall Strength Comments  abdominal strength 2/5    Left Hip Extension  3+/5    Left Hip ABduction  3+/5      Palpation   Spinal mobility  L1-L5 decreased P-A     SI assessment   left ilium is posteriorly  rotated; sacrum rotated right     Palpation comment  abdominal tenderness,       Special Tests    Special Tests  Hip Special Tests    Hip Special Tests   Trendelenberg Test      Trendelenburg Test   Findings  Positive    Side  Left    Comments  right hip drops      Transfers   Transfers  Not assessed                Objective measurements completed on examination: See above findings.    Pelvic Floor Special Questions - 07/21/18 0001    Currently Sexually Active  Yes    Is this Painful  Yes   after intercourse, feels tight vaginally  Urinary Leakage  No    Fecal incontinence  No    Falling out feeling (prolapse)  No    Skin Integrity  Erthema   around the introitus    Perineal Body/Introitus   Normal    External Palpation  tenderness located on the urethrea, introitus, and left levator ani    Prolapse  None    Pelvic Floor Internal Exam  Patient confirms identificaiton and approves PT to assess pelvic floor and treatment    Exam Type  Vaginal    Palpation  tenderness on the lef    Strength  weak squeeze, no lift               PT Education - 07/21/18 1621    Education Details  information on vaginal moisturizers and ways to take care of the vagina; gave patient samples    Person(s) Educated  Patient    Methods  Explanation;Verbal cues;Handout    Comprehension  Verbalized understanding       PT Short Term Goals - 07/21/18 1636      PT SHORT TERM GOAL #1   Title  independent with initial HEP    Time  4    Period  Weeks    Status  New    Target Date  08/18/18      PT SHORT TERM GOAL #2   Title  understand ways to manage pelvic pain with meditation, possible home TENS unit, heat, diaphragmatic breathing    Time  4    Period  Weeks    Status  New    Target Date  08/18/18      PT SHORT TERM GOAL #3   Title  pelvis stays in correct alignment for 4 weeks due to improve pelvic stability    Time  4    Period  Weeks    Status  New    Target Date  08/18/18      PT SHORT TERM GOAL #4   Title  understand perineal massage to relax the pelvic floor and prepare for penile penetration    Time  4    Period  Weeks    Status  New    Target Date  08/18/18        PT Long Term Goals - 07/21/18 1638      PT LONG TERM GOAL #1   Title  independent with HEP and understand how to progress herself including workout program    Time  12    Period  Weeks    Status  New    Target Date  10/13/18      PT LONG TERM GOAL #2   Title  pain after intercourse is minimal due to decreased pelvic floor muscle tension and using heat    Time  12    Period  Weeks    Status  New    Target Date  10/13/18      PT LONG TERM GOAL #3   Title  pelvic pain  minimal to none with standing at work due to reduction of trigger points and patient understands how to release trigger points    Time  12    Period  Weeks    Status  New    Target Date  10/13/18      PT LONG TERM GOAL #4   Title  walking 30 minutes 3 times per week due to abilty to relax the pelvic floor muscles with minimal pain  Time  12    Period  Weeks    Status  New    Target Date  10/13/18      PT LONG TERM GOAL #5   Title  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 Pelvic pain began 07/09/2017 and after laproscopic surgery began 02/06/2018. Patient is s/p laparpscopic surger on 11/30/2017 with a polyup and endometriosis removed. Since 02/06/2018 patients pain has become progressively worse. Patient reports lower abdominal pain that will radiate to the vagina is at 10/10 and back pain is intermittent at level 5/10. Patient reports pain is worse with standing, stress, walking, and after intercourse. Lumbar extension decreased by 25% with L1-L5 decreased posterior anterior movement. Patient has a positive trendelenberg on the left. Abdominal strength is 2/5. Left hip extension and abductioin is 3+/5. Left ilium is posteriorly rotated and sacrum rotated right. Pelvic floor strength is 2/5. Tenderness located in the introitus, bilateral obturator internist and levator ani with left worse than right. Q--tip test with tenderness located by the urethra and around the introitus. Patient has some vaginal dryness and increased redness in the introitus. Patient will benefit from skilled therapy to improve patients quality of lift while addressing the deficits were mentioned.     History and Personal Factors relevant to plan of care:  Laprascopic surgery  on 11/30/2017 with removing endometriosis ; scoliosis, ADD    Clinical Presentation  Evolving    Clinical Presentation due to:  difficulty with standing, sitting, exercising and going out to socialize due to pain    Clinical Decision Making  Moderate    Rehab Potential  Excellent    Clinical Impairments Affecting Rehab Potential  Laprascopic surgery on 11/30/2017 with removing endometriosis    PT Frequency  2x / week    PT Duration  12 weeks    PT Treatment/Interventions  Biofeedback;Cryotherapy;Electrical Stimulation;Moist Heat;Ultrasound;Therapeutic exercise;Therapeutic activities;Neuromuscular re-education;Patient/family education;Manual techniques;Dry needling;Passive range of motion;Taping   home TENS unit   PT Next Visit Plan  hip stretches, diaphragmatic breathing, pelvic floor relaxation exercises; pelvic floor meditation, abdominal soft tissue work; fascial release in legs; correct pelvis    Consulted and Agree with Plan of Care  Patient       Patient will benefit from skilled therapeutic intervention in order to improve the following deficits and impairments:  Increased fascial restricitons, Pain, Decreased mobility, Increased muscle spasms, Decreased activity tolerance, Decreased endurance, Decreased range of motion, Decreased strength, Impaired flexibility  Visit Diagnosis: Cramp and spasm  Muscle weakness (generalized)     Problem List There are no active problems to display for this patient.   Eulis Foster, PT 07/21/18 4:48 PM   [facility] [address] [place], [place], [address] Phone: [phone]   Fax:  [phone]  Name: Tara Castaneda MRN: [id number] Date of Birth: 05/05/1986

## 2018-07-22 ENCOUNTER — Ambulatory Visit: Payer: BC Managed Care – PPO | Admitting: Physical Therapy

## 2018-07-22 ENCOUNTER — Encounter: Payer: Self-pay | Admitting: Physical Therapy

## 2018-07-22 DIAGNOSIS — R252 Cramp and spasm: Secondary | ICD-10-CM | POA: Diagnosis not present

## 2018-07-22 DIAGNOSIS — M6281 Muscle weakness (generalized): Secondary | ICD-10-CM

## 2018-07-22 NOTE — Therapy (Signed)
[facility] [address] [place], [place], [address] Phone: [phone]   Fax:  [phone]  Physical Therapy Treatment  Patient Details  Name: Tara Castaneda MRN: [id number] Date of Birth: 1985/12/29 Referring Provider (PT): Dr. Lesia Sago   Encounter Date: 07/22/2018  PT End of Session - 07/22/18 1702    Visit Number  2    Date for PT Re-Evaluation  10/13/18    Authorization Type  [employer] state health    PT Start Time  6389    PT Stop Time  1710    PT Time Calculation (min)  55 min    Activity Tolerance  Patient tolerated treatment well    Behavior During Therapy  [facility] for tasks assessed/performed       Past Medical History:  Diagnosis Date  . ADD (attention deficit disorder)   . Asthma    MILD - LAST FLARE UP Aug 30, 2017 OFFICE NOTE ON CHART  . DJD (degenerative joint disease)    L 4 TO L5 , ARTHRITIS RIGHT KNEE  . Eczema    MOSTLY ON HANDS - OCCAS PATCHES ELSWHERE  . GERD (gastroesophageal reflux disease)   . History of heartburn   . History of kidney stones   . KP (keratosis pilaris)    UPPER ARMS  . Migraines   . Pain    HX OF STRAINED BACK SINCE ACCIDENT IN CHILDHOOD   . PONV (postoperative nausea and vomiting)    SEVERE  . Scoliosis    SLIGHT  . Seasonal allergies    MOLD AND MILDEW    Past Surgical History:  Procedure Laterality Date  . CHOLECYSTECTOMY  DEC 2004  . CYSTOSCOPY WITH RETROGRADE PYELOGRAM, URETEROSCOPY AND STENT PLACEMENT Right 10/26/2013   Procedure: CYSTOSCOPY WITH RIGHT URETEROSCOPY,  AND RIGHT URETERAL STENT PLACEMENT,  LEFT CYSTOSCOPY WITH RETROGRADE;  Surgeon: Ardis Hughs, MD;  Location: [facility];  Service: Urology;  Laterality: Right;  . EXOSTECTOMY  04/2014   BONE SPUR REMOVED  . HYSTEROSCOPY W/D&C N/A 11/30/2017   Procedure: DILATATION AND CURETTAGE /HYSTEROSCOPY;  Surgeon: Linda Hedges, DO;  Location: [facility];  Service: Gynecology;   Laterality: N/A;  . LAPAROSCOPY N/A 11/30/2017   Procedure: LAPAROSCOPY DIAGNOSTIC possible removal of endometriosis;  Surgeon: Linda Hedges, DO;  Location: [facility];  Service: Gynecology;  Laterality: N/A;  . LITHOTRIPSY  MAY 2011  . RIGHT KNEE ARTHROSCOPY     JAN 2003 AND MARCH 2004, AND DEC 2004 AND AUGUST 2008    There were no vitals filed for this visit.  Subjective Assessment - 07/22/18 1620    Subjective  I have been hurting.     Patient Stated Goals  reduce pain    Currently in Pain?  Yes    Pain Score  6     Pain Location  Abdomen    Pain Orientation  Lower    Pain Descriptors / Indicators  Radiating;Stabbing;Cramping    Pain Type  Chronic pain    Pain Radiating Towards  radiate to the vagina and the inner thighs    Pain Onset  More than a month ago    Pain Frequency  Constant    Aggravating Factors   standing ,stress, sitting long period, walking, bleeding vaginally, after intercourse    Pain Relieving Factors  sitting may relieve it at times    Multiple Pain Sites  Yes    Pain Score  4    Pain  Location  Back    Pain Orientation  Lower    Pain Descriptors / Indicators  Aching;Dull    Pain Type  Chronic pain    Pain Onset  More than a month ago    Pain Frequency  Intermittent    Aggravating Factors   increased abdominal pain, curled u pto protect from abdominal pain, sitting on hard floor at work                       [facility] Adult PT Treatment/Exercise - 07/22/18 0001      Exercises   Exercises  Lumbar      Lumbar Exercises: Stretches   Single Knee to Chest Stretch  Right;Left;1 rep;30 seconds    Hip Flexor Stretch  Right;Left;1 rep;30 seconds   sidely   Quadruped Mid Back Stretch  3 reps;30 seconds   all 3 directions   Piriformis Stretch  Right;Left;1 rep;30 seconds   supine   Other Lumbar Stretch Exercise  happy baby stretch holding 1 min with diaphgramatic stretch      Modalities   Modalities  Electrical Stimulation;Moist  Heat      Moist Heat Therapy   Number Minutes Moist Heat  15 Minutes    Moist Heat Location  Lumbar Spine   lower abdomen     Electrical Stimulation   Electrical Stimulation Location  sacral and low abdomen    Electrical Stimulation Action  IFC    Electrical Stimulation Parameters  to patinet tolerance, 15 min    Electrical Stimulation Goals  Pain      Manual Therapy   Manual Therapy  Soft tissue mobilization;Myofascial release    Soft tissue mobilization  abdominal tissue, bil. diaphgram    Myofascial Release  lower abdomen above the pubic bone going through fascial restrictions, left lower quadrant             PT Education - 07/22/18 1631    Education Details  Access Code: [email or web address]    Person(s) Educated  Patient    Methods  Explanation;Demonstration;Verbal cues;Handout    Comprehension  Verbalized understanding;Returned demonstration       PT Short Term Goals - 07/21/18 1636      PT SHORT TERM GOAL #1   Title  independent with initial HEP    Time  4    Period  Weeks    Status  New    Target Date  08/18/18      PT SHORT TERM GOAL #2   Title  understand ways to manage pelvic pain with meditation, possible home TENS unit, heat, diaphragmatic breathing    Time  4    Period  Weeks    Status  New    Target Date  08/18/18      PT SHORT TERM GOAL #3   Title  pelvis stays in correct alignment for 4 weeks due to improve pelvic stability    Time  4    Period  Weeks    Status  New    Target Date  08/18/18      PT SHORT TERM GOAL #4   Title  understand perineal massage to relax the pelvic floor and prepare for penile penetration    Time  4    Period  Weeks    Status  New    Target Date  08/18/18        PT Long Term Goals - 07/21/18 1638      PT LONG TERM  GOAL #1   Title  independent with HEP and understand how to progress herself including workout program    Time  12    Period  Weeks    Status  New    Target Date  10/13/18      PT LONG TERM GOAL #2    Title  pain after intercourse is minimal due to decreased pelvic floor muscle tension and using heat    Time  12    Period  Weeks    Status  New    Target Date  10/13/18      PT LONG TERM GOAL #3   Title  pelvic pain minimal to none with standing at work due to reduction of trigger points and patient understands how to release trigger points    Time  12    Period  Weeks    Status  New    Target Date  10/13/18      PT LONG TERM GOAL #4   Title  walking 30 minutes 3 times per week due to abilty to relax the pelvic floor muscles with minimal pain    Time  12    Period  Weeks    Status  New    Target Date  10/13/18      PT LONG TERM GOAL #5   Title  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 Patient has increased tissue mobility after soft tissue work. The right hip flexor is tighter than the left. Patient just started therapy so she has not met goals. Patient will benefit from skilled therapy to improve patients quality of life while addressing the deficits.     Rehab Potential  Excellent    Clinical Impairments Affecting Rehab Potential  Laprascopic surgery on 11/30/2017 with removing endometriosis    PT Frequency  2x / week    PT Duration  12 weeks    PT Treatment/Interventions  Biofeedback;Cryotherapy;Electrical Stimulation;Moist Heat;Ultrasound;Therapeutic exercise;Therapeutic activities;Neuromuscular re-education;Patient/family education;Manual techniques;Dry needling;Passive range of motion;Taping    PT Next Visit Plan  hamstring and trunk rotation stretches, diaphragmatic breathing, pelvic floor relaxation exercises; pelvic floor meditation, abdominal soft tissue work; fascial release in legs; correct pelvis    PT Home Exercise  Plan  Access Code: [email or web address]     Consulted and Agree with Plan of Care  Patient       Patient will benefit from skilled therapeutic intervention in order to improve the following deficits and impairments:  Increased fascial restricitons, Pain, Decreased mobility, Increased muscle spasms, Decreased activity tolerance, Decreased endurance, Decreased range of motion, Decreased strength, Impaired flexibility  Visit Diagnosis: Cramp and spasm  Muscle weakness (generalized)     Problem List There are no active problems to display for this patient.   Earlie Counts, PT 07/22/18 5:08 PM   [facility] [address] [place], [place], [address] Phone: [phone]   Fax:  [phone]  Name: Tara Castaneda MRN: [id number] Date of Birth: [birth year]

## 2018-07-22 NOTE — Patient Instructions (Signed)
Access Code: [email or web address]  URL: [email or web address]  Date: 07/22/2018  Prepared by: Eulis Fosterheryl D[employer]e [employer]   Exercises  Supine Single Knee to Chest Stretch - 2 reps - 1 sets - 15 sec hold - 1x daily - 7x weekly  Supine Piriformis Stretch - 2 reps - 1 sets - 30 sec hold - 1x daily - 7x weekly  Supine Pelvic Floor Stretch - 1 reps - 1 sets - 1 min hold - 1x daily - 7x weekly  Sidelying Quadriceps Stretch - 2 reps - 1 sets - 30 sec hold - 1x daily - 7x weekly  Child's Pose Stretch - 1 reps - 1 sets - 30 sec hold - 1x daily - 7x weekly  Child's Pose with Sidebending - 2 reps - 1 sets - 30 sec hold - 1x daily - 7x weekly  [facility] [address], [address] [place], [name] [address] Phone # [phone] Fax [phone]

## 2018-07-28 ENCOUNTER — Ambulatory Visit: Payer: BC Managed Care – PPO | Admitting: Physical Therapy

## 2018-07-28 ENCOUNTER — Encounter: Payer: Self-pay | Admitting: Physical Therapy

## 2018-07-28 DIAGNOSIS — M6281 Muscle weakness (generalized): Secondary | ICD-10-CM

## 2018-07-28 DIAGNOSIS — R252 Cramp and spasm: Secondary | ICD-10-CM

## 2018-07-28 NOTE — Patient Instructions (Signed)
TENS 7000     [facility] [address], [address] [place], [name] [address] Phone # [phone] Fax [phone]  YouTube: [employer] - pelvic floor relaxation meditation

## 2018-07-28 NOTE — Therapy (Signed)
[facility] [address], [address] [place], [place], [address] Phone: [phone]   Fax:  [phone]  Physical Therapy Treatment  Patient Details  Name: Tara Castaneda MRN: [id number] Date of Birth: 12-08-1985 Referring Provider (PT): Dr. Marlin Canary   Encounter Date: 07/28/2018  PT End of Session - 07/28/18 1408    Visit Number  3    Date for PT Re-Evaluation  10/13/18    Authorization Type  [employer] state health    PT Start Time  1400    PT Stop Time  1450    PT Time Calculation (min)  50 min    Activity Tolerance  Patient tolerated treatment well    Behavior During Therapy  [facility] for tasks assessed/performed       Past Medical History:  Diagnosis Date  . ADD (attention deficit disorder)   . Asthma    MILD - LAST FLARE UP Aug 30, 2017 OFFICE NOTE ON CHART  . DJD (degenerative joint disease)    L 4 TO L5 , ARTHRITIS RIGHT KNEE  . Eczema    MOSTLY ON HANDS - OCCAS PATCHES ELSWHERE  . GERD (gastroesophageal reflux disease)   . History of heartburn   . History of kidney stones   . KP (keratosis pilaris)    UPPER ARMS  . Migraines   . Pain    HX OF STRAINED BACK SINCE ACCIDENT IN CHILDHOOD   . PONV (postoperative nausea and vomiting)    SEVERE  . Scoliosis    SLIGHT  . Seasonal allergies    MOLD AND MILDEW    Past Surgical History:  Procedure Laterality Date  . CHOLECYSTECTOMY  DEC 2004  . CYSTOSCOPY WITH RETROGRADE PYELOGRAM, URETEROSCOPY AND STENT PLACEMENT Right 10/26/2013   Procedure: CYSTOSCOPY WITH RIGHT URETEROSCOPY,  AND RIGHT URETERAL STENT PLACEMENT,  LEFT CYSTOSCOPY WITH RETROGRADE;  Surgeon: Crist Fat, MD;  Location: [facility];  Service: Urology;  Laterality: Right;  . EXOSTECTOMY  04/2014   BONE SPUR REMOVED  . HYSTEROSCOPY W/D&C N/A 11/30/2017   Procedure: DILATATION AND CURETTAGE /HYSTEROSCOPY;  Surgeon: Mitchel Honour, DO;  Location: [facility];  Service: Gynecology;   Laterality: N/A;  . LAPAROSCOPY N/A 11/30/2017   Procedure: LAPAROSCOPY DIAGNOSTIC possible removal of endometriosis;  Surgeon: Mitchel Honour, DO;  [facility];  Service: Gynecology;  Laterality: N/A;  . LITHOTRIPSY  MAY 2011  . RIGHT KNEE ARTHROSCOPY     JAN 2003 AND MARCH 2004, AND DEC 2004 AND AUGUST 2008    There were no vitals filed for this visit.  Subjective Assessment - 07/28/18 1404    Subjective  I am sore today.    Patient Stated Goals  reduce pain    Currently in Pain?  Yes    Pain Score  7     Pain Location  Abdomen    Pain Orientation  Lower    Pain Descriptors / Indicators  Stabbing;Cramping;Radiating    Pain Type  Chronic pain    Pain Radiating Towards  radiate to vagina and inner thighs    Pain Onset  More than a month ago    Pain Frequency  Constant    Multiple Pain Sites  No                       [facility] Adult PT Treatment/Exercise - 07/28/18 0001      Exercises   Exercises  Lumbar  Lumbar Exercises: Stretches   Active Hamstring Stretch  Right;Left;3 reps;20 seconds    Lower Trunk Rotation  3 reps;20 seconds   windshield wiper with LE   Hip Flexor Stretch  Right;Left;1 rep;30 seconds   sidely   Press Ups  5 reps;10 reps    Figure 4 Stretch  2 reps;30 seconds      Modalities   Modalities  Electrical Stimulation;Moist Heat      Moist Heat Therapy   Number Minutes Moist Heat  12 Minutes    Moist Heat Location  Lumbar Spine      Electrical Stimulation   Electrical Stimulation Location  sacral and low abdomen    Electrical Stimulation Action  IFC    Electrical Stimulation Parameters  to tolerance    Electrical Stimulation Goals  Pain      Manual Therapy   Soft tissue mobilization  abdominal tissue, bil. diaphgram    Myofascial Release  lower abdomen above the pubic bone going through fascial restrictions,             PT Education - 07/28/18 1509    Education Details  info on TENS and pelvic floor  meditation    Person(s) Educated  Patient    Methods  Explanation;Demonstration;Handout;Verbal cues    Comprehension  Verbalized understanding;Returned demonstration       PT Short Term Goals - 07/28/18 1552      PT SHORT TERM GOAL #1   Title  independent with initial HEP    Status  Achieved      PT SHORT TERM GOAL #2   Title  understand ways to manage pelvic pain with meditation, possible home TENS unit, heat, diaphragmatic breathing    Baseline  given education today    Status  On-going        PT Long Term Goals - 07/21/18 1638      PT LONG TERM GOAL #1   Title  independent with HEP and understand how to progress herself including workout program    Time  12    Period  Weeks    Status  New    Target Date  10/13/18      PT LONG TERM GOAL #2   Title  pain after intercourse is minimal due to decreased pelvic floor muscle tension and using heat    Time  12    Period  Weeks    Status  New    Target Date  10/13/18      PT LONG TERM GOAL #3   Title  pelvic pain minimal to none with standing at work due to reduction of trigger points and patient understands how to release trigger points    Time  12    Period  Weeks    Status  New    Target Date  10/13/18      PT LONG TERM GOAL #4   Title  walking 30 minutes 3 times per week due to abilty to relax the pelvic floor muscles with minimal pain    Time  12    Period  Weeks    Status  New    Target Date  10/13/18      PT LONG TERM GOAL #5   Title  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 She reports feeling better with heat and stim.  She was able to perform diaphragmatic breathing during stim and heat treatment.  Pt continues to have pain, but is learning tactics to manage.   Pt will benefit from skilled PT in order to progress strength and ROM and pain management techniques in order to return to maximum function.    Clinical Impairments Affecting Rehab Potential  Laprascopic surgery on 11/30/2017 with removing endometriosis    PT Treatment/Interventions  Biofeedback;Cryotherapy;Electrical Stimulation;Moist Heat;Ultrasound;Therapeutic exercise;Therapeutic activities;Neuromuscular re-education;Patient/family education;Manual techniques;Dry needling;Passive range of motion;Taping    PT Next Visit Plan  f/u on pelvic floor relaxation exercises; pelvic floor meditation, abdominal soft tissue work; fascial work to Te[employer] CorporationLE and lumbar, correct pelvis    PT Home Exercise Plan  Access Code: [email or web address] - add rotation and prone press ups     Consulted and Agree with Plan of Care  Patient       Patient will benefit from skilled therapeutic intervention in order to improve the following deficits and impairments:  Increased fascial restricitons, Pain, Decreased mobility, Increased muscle spasms, Decreased activity tolerance, Decreased endurance, Decreased range of motion, Decreased strength, Impaired flexibility  Visit Diagnosis: Cramp and spasm  Muscle weakness (generalized)     Problem List There are no active problems to display for this patient.   Vincente PoliJakki Crosser, PT 07/28/2018, 4:09 PM   [facility] [address], [address] [place], [name], [address] Phone: [phone]   Fax:  [phone]  Name: Tara Castaneda MRN: [id number] Date of Birth: 11/06/1985

## 2018-08-02 ENCOUNTER — Ambulatory Visit: Payer: BC Managed Care – PPO | Admitting: Physical Therapy

## 2018-08-02 DIAGNOSIS — R252 Cramp and spasm: Secondary | ICD-10-CM | POA: Diagnosis not present

## 2018-08-02 DIAGNOSIS — M6281 Muscle weakness (generalized): Secondary | ICD-10-CM

## 2018-08-02 NOTE — Therapy (Signed)
[facility] [address], [address] [place], [name], [address] Phone: [phone]   Fax:  [phone]  Physical Therapy Treatment  Patient Details  Name: Tara Castaneda MRN: [id number] Date of Birth: 10/08/1985 Referring Provider (PT): Dr. Marlin CanaryErica Robinson   Encounter Date: 08/02/2018  PT End of Session - 08/02/18 1354    Visit Number  4    Date for PT Re-Evaluation  10/13/18    Authorization Type  [employer] state health    PT Start Time  1356    PT Stop Time  1459    PT Time Calculation (min)  63 min    Activity Tolerance  Patient tolerated treatment well    Behavior During Therapy  [facility] for tasks assessed/performed       Past Medical History:  Diagnosis Date  . ADD (attention deficit disorder)   . Asthma    MILD - LAST FLARE UP Aug 30, 2017 OFFICE NOTE ON CHART  . DJD (degenerative joint disease)    L 4 TO L5 , ARTHRITIS RIGHT KNEE  . Eczema    MOSTLY ON HANDS - OCCAS PATCHES ELSWHERE  . GERD (gastroesophageal reflux disease)   . History of heartburn   . History of kidney stones   . KP (keratosis pilaris)    UPPER ARMS  . Migraines   . Pain    HX OF STRAINED BACK SINCE ACCIDENT IN CHILDHOOD   . PONV (postoperative nausea and vomiting)    SEVERE  . Scoliosis    SLIGHT  . Seasonal allergies    MOLD AND MILDEW    Past Surgical History:  Procedure Laterality Date  . CHOLECYSTECTOMY  DEC 2004  . CYSTOSCOPY WITH RETROGRADE PYELOGRAM, URETEROSCOPY AND STENT PLACEMENT Right 10/26/2013   Procedure: CYSTOSCOPY WITH RIGHT URETEROSCOPY,  AND RIGHT URETERAL STENT PLACEMENT,  LEFT CYSTOSCOPY WITH RETROGRADE;  Surgeon: Crist FatBenjamin W Herrick, MD;  [facility];  Service: Urology;  Laterality: Right;  . EXOSTECTOMY  04/2014   BONE SPUR REMOVED  . HYSTEROSCOPY W/D&C N/A 11/30/2017   Procedure: DILATATION AND CURETTAGE /HYSTEROSCOPY;  Surgeon: Mitchel HonourMorris, Megan, DO;  Location: [facility];  Service: Gynecology;   Laterality: N/A;  . LAPAROSCOPY N/A 11/30/2017   Procedure: LAPAROSCOPY DIAGNOSTIC possible removal of endometriosis;  Surgeon: Mitchel HonourMorris, Megan, DO;  [facility];  Service: Gynecology;  Laterality: N/A;  . LITHOTRIPSY  MAY 2011  . RIGHT KNEE ARTHROSCOPY     JAN 2003 AND MARCH 2004, AND DEC 2004 AND AUGUST 2008    There were no vitals filed for this visit.  Subjective Assessment - 08/02/18 1401    Subjective  I was walking and shopping and that flared things up on Saturday, but it is better today.    Patient Stated Goals  reduce pain    Currently in Pain?  Yes    Pain Score  5     Pain Location  Abdomen    Pain Orientation  Lower    Pain Descriptors / Indicators  Stabbing    Pain Type  Chronic pain    Pain Onset  More than a month ago    Pain Frequency  Intermittent                       [facility] Adult PT Treatment/Exercise - 08/02/18 0001      Neuro Re-ed    Neuro Re-ed Details   pelvic floor meditation  Exercises   Exercises  Lumbar      Lumbar Exercises: Stretches   Active Hamstring Stretch  Right;Left;3 reps;20 seconds    Other Lumbar Stretch Exercise  thoracic rotation      Lumbar Exercises: Supine   Bent Knee Raise  20 reps;2 seconds    Large Ball Abdominal Isometric  20 reps;1 second   press red ball and UE flexion with ball     Moist Heat Therapy   Number Minutes Moist Heat  15 Minutes    Moist Heat Location  Lumbar Spine      Electrical Stimulation   Electrical Stimulation Location  sacral and low abdomen    Electrical Stimulation Action  premod    Electrical Stimulation Parameters  to tolerance    Electrical Stimulation Goals  Pain      Manual Therapy   Soft tissue mobilization  abdominal tissue, bil. diaphgram    Myofascial Release  lower abdomen above the pubic bone going through fascial restrictions, sacral distraction and with pelvic compression               PT Short Term Goals - 07/28/18 1552       PT SHORT TERM GOAL #1   Title  independent with initial HEP    Status  Achieved      PT SHORT TERM GOAL #2   Title  understand ways to manage pelvic pain with meditation, possible home TENS unit, heat, diaphragmatic breathing    Baseline  given education today    Status  On-going        PT Long Term Goals - 07/21/18 1638      PT LONG TERM GOAL #1   Title  independent with HEP and understand how to progress herself including workout program    Time  12    Period  Weeks    Status  New    Target Date  10/13/18      PT LONG TERM GOAL #2   Title  pain after intercourse is minimal due to decreased pelvic floor muscle tension and using heat    Time  12    Period  Weeks    Status  New    Target Date  10/13/18      PT LONG TERM GOAL #3   Title  pelvic pain minimal to none with standing at work due to reduction of trigger points and patient understands how to release trigger points    Time  12    Period  Weeks    Status  New    Target Date  10/13/18      PT LONG TERM GOAL #4   Title  walking 30 minutes 3 times per week due to abilty to relax the pelvic floor muscles with minimal pain    Time  12    Period  Weeks    Status  New    Target Date  10/13/18      PT LONG TERM GOAL #5   Title  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   She had no increased  pain with treatment today and no increased pain after last session.  Pt will continue to benefit from skilled PT to progress strength and work on fascial restricitons into pelvic floor as tolerated.    PT Treatment/Interventions  Biofeedback;Cryotherapy;Electrical Stimulation;Moist Heat;Ultrasound;Therapeutic exercise;Therapeutic activities;Neuromuscular re-education;Patient/family  education;Manual techniques;Dry needling;Passive range of motion;Taping    PT Next Visit Plan  f/u on pelvic floor relaxation exercises; pelvic floor meditation; fascial work to LE and lumbar, internal STM    PT Home Exercise Plan  Access Code: [email or web address] - add rotation and prone press ups, bent knee raises    Consulted and Agree with Plan of Care  Patient       Patient will benefit from skilled therapeutic intervention in order to improve the following deficits and impairments:  Increased fascial restricitons, Pain, Decreased mobility, Increased muscle spasms, Decreased activity tolerance, Decreased endurance, Decreased range of motion, Decreased strength, Impaired flexibility  Visit Diagnosis: Cramp and spasm  Muscle weakness (generalized)     Problem List There are no active problems to display for this patient.   Vincente Poli, PT 08/02/2018, 3:28 PM  [facility] [address] [place], [place], [address] Phone: [phone]   Fax:  [phone]  Name: Tara Castaneda MRN: [id number] Date of Birth: 09/19/1985

## 2018-08-03 ENCOUNTER — Encounter

## 2018-08-10 ENCOUNTER — Ambulatory Visit: Payer: BC Managed Care – PPO | Attending: Obstetrics & Gynecology | Admitting: Physical Therapy

## 2018-08-10 ENCOUNTER — Encounter: Payer: Self-pay | Admitting: Physical Therapy

## 2018-08-10 DIAGNOSIS — R252 Cramp and spasm: Secondary | ICD-10-CM | POA: Insufficient documentation

## 2018-08-10 DIAGNOSIS — M6281 Muscle weakness (generalized): Secondary | ICD-10-CM | POA: Diagnosis present

## 2018-08-10 NOTE — Therapy (Signed)
[facility] [address], [address] [place], [name], [address] Phone: [phone]   Fax:  [phone]  Physical Therapy Treatment  Patient Details  Name: Tara Castaneda MRN: [id number] Date of Birth: 05/09/1986 Referring Provider (PT): Dr. Marlin CanaryErica Robinson   Encounter Date: 08/10/2018  PT End of Session - 08/10/18 1623    Visit Number  5    Date for PT Re-Evaluation  10/13/18    Authorization Type  [employer] state health    PT Start Time  1620    PT Stop Time  1708    PT Time Calculation (min)  48 min    Activity Tolerance  Patient tolerated treatment well    Behavior During Therapy  [facility] for tasks assessed/performed       Past Medical History:  Diagnosis Date  . ADD (attention deficit disorder)   . Asthma    MILD - LAST FLARE UP Aug 30, 2017 OFFICE NOTE ON CHART  . DJD (degenerative joint disease)    L 4 TO L5 , ARTHRITIS RIGHT KNEE  . Eczema    MOSTLY ON HANDS - OCCAS PATCHES ELSWHERE  . GERD (gastroesophageal reflux disease)   . History of heartburn   . History of kidney stones   . KP (keratosis pilaris)    UPPER ARMS  . Migraines   . Pain    HX OF STRAINED BACK SINCE ACCIDENT IN CHILDHOOD   . PONV (postoperative nausea and vomiting)    SEVERE  . Scoliosis    SLIGHT  . Seasonal allergies    MOLD AND MILDEW    Past Surgical History:  Procedure Laterality Date  . CHOLECYSTECTOMY  DEC 2004  . CYSTOSCOPY WITH RETROGRADE PYELOGRAM, URETEROSCOPY AND STENT PLACEMENT Right 10/26/2013   Procedure: CYSTOSCOPY WITH RIGHT URETEROSCOPY,  AND RIGHT URETERAL STENT PLACEMENT,  LEFT CYSTOSCOPY WITH RETROGRADE;  Surgeon: Crist FatBenjamin W Herrick, MD;  [facility];  Service: Urology;  Laterality: Right;  . EXOSTECTOMY  04/2014   BONE SPUR REMOVED  . HYSTEROSCOPY W/D&C N/A 11/30/2017   Procedure: DILATATION AND CURETTAGE /HYSTEROSCOPY;  Surgeon: Mitchel HonourMorris, Megan, DO;  Location: [facility];  Service: Gynecology;   Laterality: N/A;  . LAPAROSCOPY N/A 11/30/2017   Procedure: LAPAROSCOPY DIAGNOSTIC possible removal of endometriosis;  Surgeon: Mitchel HonourMorris, Megan, DO;  [facility];  Service: Gynecology;  Laterality: N/A;  . LITHOTRIPSY  MAY 2011  . RIGHT KNEE ARTHROSCOPY     JAN 2003 AND MARCH 2004, AND DEC 2004 AND AUGUST 2008    There were no vitals filed for this visit.  Subjective Assessment - 08/10/18 1719    Subjective  I have been feeling more sore and the doctor told me to use the valium 2x/day.  My back is feeling better.    Patient Stated Goals  reduce pain    Currently in Pain?  Yes    Pain Score  6     Pain Location  Abdomen    Pain Orientation  Lower    Pain Descriptors / Indicators  Stabbing    Pain Onset  More than a month ago    Multiple Pain Sites  No                       [facility] Adult PT Treatment/Exercise - 08/10/18 0001      Self-Care   Self-Care  Other Self-Care Comments    Other Self-Care Comments   self massage  Moist Heat Therapy   Number Minutes Moist Heat  15 Minutes    Moist Heat Location  Other (comment)   abdomen     Electrical Stimulation   Electrical Stimulation Location  sacral and low abdomen    Electrical Stimulation Action  premod    Electrical Stimulation Parameters  to tolerance    Electrical Stimulation Goals  Pain      Manual Therapy   Manual therapy comments  identity confirmed and patient informed and given consent to perfrom internal soft tissue mobilization    Soft tissue mobilization  fascial release from urethraand along sides of vaginal canal; bilateral ilio coccygeus             PT Education - 08/10/18 1658    Education Details  massage and pelvic massage tool    Person(s) Educated  Patient    Methods  Explanation;Demonstration;Handout;Verbal cues    Comprehension  Verbalized understanding;Returned demonstration       PT Short Term Goals - 07/28/18 1552      PT SHORT TERM GOAL #1    Title  independent with initial HEP    Status  Achieved      PT SHORT TERM GOAL #2   Title  understand ways to manage pelvic pain with meditation, possible home TENS unit, heat, diaphragmatic breathing    Baseline  given education today    Status  On-going        PT Long Term Goals - 07/21/18 1638      PT LONG TERM GOAL #1   Title  independent with HEP and understand how to progress herself including workout program    Time  12    Period  Weeks    Status  New    Target Date  10/13/18      PT LONG TERM GOAL #2   Title  pain after intercourse is minimal due to decreased pelvic floor muscle tension and using heat    Time  12    Period  Weeks    Status  New    Target Date  10/13/18      PT LONG TERM GOAL #3   Title  pelvic pain minimal to none with standing at work due to reduction of trigger points and patient understands how to release trigger points    Time  12    Period  Weeks    Status  New    Target Date  10/13/18      PT LONG TERM GOAL #4   Title  walking 30 minutes 3 times per week due to abilty to relax the pelvic floor muscles with minimal pain    Time  12    Period  Weeks    Status  New    Target Date  10/13/18      PT LONG TERM GOAL #5   Title  pain 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  Pt had good response from [facility] STM today to pelvic floor.  She was able to understnad how to perform self massage with review of  techniques used during session today.  Pt responded well without increased pain after treatment today.  Pt will cont to benefit from skilled PT  to work on improved soft tissue.    PT Treatment/Interventions  Biofeedback;Cryotherapy;Electrical Stimulation;Moist Heat;Ultrasound;Therapeutic  exercise;Therapeutic activities;Neuromuscular re-education;Patient/family education;Manual techniques;Dry needling;Passive range of motion;Taping    PT Next Visit Plan  f/u on self massage, fascial release to lumbar and pelvic floor    Consulted and Agree with Plan of Care  Patient       Patient will benefit from skilled therapeutic intervention in order to improve the following deficits and impairments:  Increased fascial restricitons, Pain, Decreased mobility, Increased muscle spasms, Decreased activity tolerance, Decreased endurance, Decreased range of motion, Decreased strength, Impaired flexibility  Visit Diagnosis: Cramp and spasm  Muscle weakness (generalized)     Problem List There are no active problems to display for this patient.   Vincente Poli, PT 08/10/2018, 5:21 PM  [facility] [address], [address] [place], [place], [address] Phone: [phone]   Fax:  [phone]  Name: Tara Castaneda MRN: [id number] Date of Birth: Feb 04, 1986

## 2018-08-10 NOTE — Patient Instructions (Signed)
STRETCHING THE PELVIC FLOOR MUSCLES NO DILATOR  Supplies . Vaginal lubricant . Mirror (optional) . Gloves (optional) Positioning . Start in a semi-reclined position with your head propped up. Bend your knees and place your thumb or finger at the vaginal opening. Procedure . Apply a moderate amount of lubricant on the outer skin of your vagina, the labia minora.  Apply additional lubricant to your finger. Marland Kitchen. Spread the skin away from the vaginal opening. Place the end of your finger at the opening. . Do a maximum contraction of the pelvic floor muscles. Tighten the vagina and the anus maximally and relax. . When you know they are relaxed, gently and slowly insert your finger into your vagina, directing your finger slightly downward, for 2-3 inches of insertion. . Relax and stretch the 6 o'clock position . Hold each stretch for _2 min__ and repeat __1_ time with rest breaks of _1__ seconds between each stretch. . Repeat the stretching in the 4 o'clock and 8 o'clock positions. . Total time should be _6__ minutes, _1__ x per day.  Note the amount of theme your were able to achieve and your tolerance to your finger in your vagina. . Once you have accomplished the techniques you may try them in standing with one foot resting on the tub, or in other positions.  This is a good stretch to do in the shower if you don't need to use lubricant.    [email or web address]  Go to STORE, then PELVIC PAIN, then INTERNAL MASSAGE  [facility] [address], [address] [place], [name] [address] Phone # [phone] Fax [phone]

## 2018-08-11 ENCOUNTER — Encounter

## 2018-08-12 ENCOUNTER — Ambulatory Visit: Payer: BC Managed Care – PPO | Admitting: Physical Therapy

## 2018-08-12 ENCOUNTER — Encounter: Payer: Self-pay | Admitting: Physical Therapy

## 2018-08-12 DIAGNOSIS — M6281 Muscle weakness (generalized): Secondary | ICD-10-CM

## 2018-08-12 DIAGNOSIS — R252 Cramp and spasm: Secondary | ICD-10-CM

## 2018-08-12 NOTE — Therapy (Signed)
[facility] [name], [address] [place], [name], [address] Phone: [phone]   Fax:  [phone]  Physical Therapy Treatment  Patient Details  Name: Tara BishopKimberly D Bartmess MRN: [id number] Date of Birth: 01/31/1986 Referring Provider (PT): Dr. Marlin CanaryErica Robinson   Encounter Date: 08/12/2018  PT End of Session - 08/12/18 1621    Visit Number  6    Date for PT Re-Evaluation  10/13/18    Authorization Type  [employer] state health    PT Start Time  1531    PT Stop Time  1630    PT Time Calculation (min)  59 min    Activity Tolerance  Patient tolerated treatment well    Behavior During Therapy  [facility] for tasks assessed/performed       Past Medical History:  Diagnosis Date  . ADD (attention deficit disorder)   . Asthma    MILD - LAST FLARE UP Aug 30, 2017 OFFICE NOTE ON CHART  . DJD (degenerative joint disease)    L 4 TO L5 , ARTHRITIS RIGHT KNEE  . Eczema    MOSTLY ON HANDS - OCCAS PATCHES ELSWHERE  . GERD (gastroesophageal reflux disease)   . History of heartburn   . History of kidney stones   . KP (keratosis pilaris)    UPPER ARMS  . Migraines   . Pain    HX OF STRAINED BACK SINCE ACCIDENT IN CHILDHOOD   . PONV (postoperative nausea and vomiting)    SEVERE  . Scoliosis    SLIGHT  . Seasonal allergies    MOLD AND MILDEW    Past Surgical History:  Procedure Laterality Date  . CHOLECYSTECTOMY  DEC 2004  . CYSTOSCOPY WITH RETROGRADE PYELOGRAM, URETEROSCOPY AND STENT PLACEMENT Right 10/26/2013   Procedure: CYSTOSCOPY WITH RIGHT URETEROSCOPY,  AND RIGHT URETERAL STENT PLACEMENT,  LEFT CYSTOSCOPY WITH RETROGRADE;  Surgeon: Crist FatBenjamin W Herrick, MD;  Location: [facility];  Service: Urology;  Laterality: Right;  . EXOSTECTOMY  04/2014   BONE SPUR REMOVED  . HYSTEROSCOPY W/D&C N/A 11/30/2017   Procedure: DILATATION AND CURETTAGE /HYSTEROSCOPY;  Surgeon: Mitchel HonourMorris, Megan, DO;  Location: [facility];  Service: Gynecology;   Laterality: N/A;  . LAPAROSCOPY N/A 11/30/2017   Procedure: LAPAROSCOPY DIAGNOSTIC possible removal of endometriosis;  Surgeon: Mitchel HonourMorris, Megan, DO;  [facility];  Service: Gynecology;  Laterality: N/A;  . LITHOTRIPSY  MAY 2011  . RIGHT KNEE ARTHROSCOPY     JAN 2003 AND MARCH 2004, AND DEC 2004 AND AUGUST 2008    There were no vitals filed for this visit.  Subjective Assessment - 08/12/18 1618    Subjective  I got my period yesterday.  I should not have my period now, but that is probably why I have been more flared up.    Patient Stated Goals  reduce pain    Currently in Pain?  Yes    Pain Score  6     Pain Location  Abdomen    Pain Orientation  Lower    Pain Descriptors / Indicators  Stabbing    Pain Type  Chronic pain    Pain Onset  More than a month ago    Multiple Pain Sites  No                       [facility] Adult PT Treatment/Exercise - 08/12/18 0001      Moist Heat Therapy   Number Minutes Moist Heat  20 Minutes    Moist Heat Location  Lumbar Spine   during internal STM; 12 min on abdomen during e-stim     Electrical Stimulation   Electrical Stimulation Location  abdomen    Electrical Stimulation Action  IFC    Electrical Stimulation Parameters  to tolerance    Electrical Stimulation Goals  Pain      Manual Therapy   Manual therapy comments  identity confirmed and patient informed and given consent to perfrom internal soft tissue mobilization    Soft tissue mobilization  fascial release from urethraand along sides of vaginal canal; bilateral ilio coccygeus, pubococcygeus Rt>Lt; glutes and QL bilaterally Rt>Lt               PT Short Term Goals - 07/28/18 1552      PT SHORT TERM GOAL #1   Title  independent with initial HEP    Status  Achieved      PT SHORT TERM GOAL #2   Title  understand ways to manage pelvic pain with meditation, possible home TENS unit, heat, diaphragmatic breathing    Baseline  given education  today    Status  On-going        PT Long Term Goals - 07/21/18 1638      PT LONG TERM GOAL #1   Title  independent with HEP and understand how to progress herself including workout program    Time  12    Period  Weeks    Status  New    Target Date  10/13/18      PT LONG TERM GOAL #2   Title  pain after intercourse is minimal due to decreased pelvic floor muscle tension and using heat    Time  12    Period  Weeks    Status  New    Target Date  10/13/18      PT LONG TERM GOAL #3   Title  pelvic pain minimal to none with standing at work due to reduction of trigger points and patient understands how to release trigger points    Time  12    Period  Weeks    Status  New    Target Date  10/13/18      PT LONG TERM GOAL #4   Title  walking 30 minutes 3 times per week due to abilty to relax the pelvic floor muscles with minimal pain    Time  12    Period  Weeks    Status  New    Target Date  10/13/18      PT LONG TERM GOAL #5   Title  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  Pt tolerated soft tissue work with some lengthening of soft tissue Rt>Lt was tight and tender.  Pt had tight pubococcygeus and glutes bilaterally.  Pt had tight QL.  Pt will benefit from skilled PT to continue working on increased soft tissue length and pelvic floor relaxation exercises with modalities for pain management.    PT Treatment/Interventions  Biofeedback;Cryotherapy;Electrical Stimulation;Moist Heat;Ultrasound;Therapeutic exercise;Therapeutic activities;Neuromuscular re-education;Patient/family education;Manual techniques;Dry  needling;Passive range of motion;Taping    PT Next Visit Plan  internal STM, lumbar and hip fascial release and STM, ROM and  stretches and breathing with bulging in variety of positions.    Consulted and Agree with Plan of Care  Patient       Patient will benefit from skilled therapeutic intervention in order to improve the following deficits and impairments:  Increased fascial restricitons, Pain, Decreased mobility, Increased muscle spasms, Decreased activity tolerance, Decreased endurance, Decreased range of motion, Decreased strength, Impaired flexibility  Visit Diagnosis: Cramp and spasm  Muscle weakness (generalized)     Problem List There are no active problems to display for this patient.   Vincente Poli, PT 08/12/2018, 4:36 PM  [facility] [address], [address] [place], [place], [address] Phone: [phone]   Fax:  [phone]  Name: LEXA CORONADO MRN: [id number] Date of Birth: 1986/06/27

## 2018-08-16 ENCOUNTER — Ambulatory Visit: Payer: BC Managed Care – PPO | Admitting: Physical Therapy

## 2018-08-16 ENCOUNTER — Encounter: Payer: Self-pay | Admitting: Physical Therapy

## 2018-08-16 DIAGNOSIS — M6281 Muscle weakness (generalized): Secondary | ICD-10-CM

## 2018-08-16 DIAGNOSIS — R252 Cramp and spasm: Secondary | ICD-10-CM | POA: Diagnosis not present

## 2018-08-16 NOTE — Therapy (Signed)
[facility] [address], [address] [place], [name], [address] Phone: [phone]   Fax:  [phone]  Physical Therapy Treatment  Patient Details  Name: Tara [place]eda MRN: [id number] Date of Birth: 03/04/1986 Referring Provider (PT): Dr. Marlin CanaryErica [place]   Encounter Date: 08/16/2018  PT End of Session - 08/16/18 1719    Visit Number  7    Date for PT Re-Evaluation  10/13/18    Authorization Type  [employer] state health    PT Start Time  1533    PT Stop Time  1623    PT Time Calculation (min)  50 min    Activity Tolerance  Patient tolerated treatment well    Behavior During Therapy  [facility] for tasks assessed/performed       Past Medical History:  Diagnosis Date  . ADD (attention deficit disorder)   . Asthma    MILD - LAST FLARE UP Aug 30, 2017 OFFICE NOTE ON CHART  . DJD (degenerative joint disease)    L 4 TO L5 , ARTHRITIS RIGHT KNEE  . Eczema    MOSTLY ON HANDS - OCCAS PATCHES ELSWHERE  . GERD (gastroesophageal reflux disease)   . History of heartburn   . History of kidney stones   . KP (keratosis pilaris)    UPPER ARMS  . Migraines   . Pain    HX OF STRAINED BACK SINCE ACCIDENT IN CHILDHOOD   . PONV (postoperative nausea and vomiting)    SEVERE  . Scoliosis    SLIGHT  . Seasonal allergies    MOLD AND MILDEW    Past Surgical History:  Procedure Laterality Date  . CHOLECYSTECTOMY  DEC 2004  . CYSTOSCOPY WITH RETROGRADE PYELOGRAM, URETEROSCOPY AND STENT PLACEMENT Right 10/26/2013   Procedure: CYSTOSCOPY WITH RIGHT URETEROSCOPY,  AND RIGHT URETERAL STENT PLACEMENT,  LEFT CYSTOSCOPY WITH RETROGRADE;  Surgeon: Tara FatBenjamin W Herrick, MD;  Location: [facility];  Service: Urology;  Laterality: Right;  . EXOSTECTOMY  04/2014   BONE SPUR REMOVED  . HYSTEROSCOPY W/D&C N/A 11/30/2017   Procedure: DILATATION AND CURETTAGE /HYSTEROSCOPY;  Surgeon: Tara [place]eda;  Location:  [place];  Service: Gynecology;   Laterality: N/A;  . LAPAROSCOPY N/A 11/30/2017   Procedure: LAPAROSCOPY DIAGNOSTIC possible removal of endometriosis;  Surgeon: Tara [facility];  [facility] ;  Service: Gynecology;  Laterality: N/A;  . LITHOTRIPSY  MAY 2011  . RIGHT KNEE ARTHROSCOPY     JAN 2003 AND MARCH 2004, AND DEC 2004 AND AUGUST 2008    There were no vitals filed for this visit.  Subjective Assessment - 08/16/18 1544    Subjective  I have still been haivng pain.  Today I almost felt like I couldn't stand.  I had some discharge with the valium and it doesn't seem like it is helping the pain anymore.    Currently in Pain?  Yes    Pain Score  8                        [facility] Adult PT Treatment/Exercise - 08/16/18 0001      Self-Care   Other Self-Care Comments   sitting on foam noodle for pelvic floor relax      Neuro Re-ed    Neuro Re-ed Details   sitting on ball circles and bouncing for pelvic floor relaxation      Moist Heat Therapy   Number Minutes Moist Heat  15 Minutes  Moist Heat Location  --   abdomen     Electrical Stimulation   Electrical Stimulation Location  abdomen    Electrical Stimulation Action  IFC    Electrical Stimulation Parameters  to tolerance    Electrical Stimulation Goals  Pain      Manual Therapy   Soft tissue mobilization  bilateral glutes and lumbar paraspinals    Myofascial Release  lower abdomen above the pubic bone going through fascial restrictions, sacral distraction and with pelvic compression               PT Short Term Goals - 07/28/18 1552      PT SHORT TERM GOAL #1   Title  independent with initial HEP    Status  Achieved      PT SHORT TERM GOAL #2   Title  understand ways to manage pelvic pain with meditation, possible home TENS unit, heat, diaphragmatic breathing    Baseline  given education today    Status  On-going        PT Long Term Goals - 07/21/18 1638      PT LONG TERM GOAL #1   Title   independent with HEP and understand how to progress herself including workout program    Time  12    Period  Weeks    Status  New    Target Date  10/13/18      PT LONG TERM GOAL #2   Title  pain after intercourse is minimal due to decreased pelvic floor muscle tension and using heat    Time  12    Period  Weeks    Status  New    Target Date  10/13/18      PT LONG TERM GOAL #3   Title  pelvic pain minimal to none with standing at work due to reduction of trigger points and patient understands how to release trigger points    Time  12    Period  Weeks    Status  New    Target Date  10/13/18      PT LONG TERM GOAL #4   Title  walking 30 minutes 3 times per week due to abilty to relax the pelvic floor muscles with minimal pain    Time  12    Period  Weeks    Status  New    Target Date  10/13/18      PT LONG TERM GOAL #5   Title  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  She reports therapy is helping and has decreased symptoms after treatment today.  Pt had release of fascial restricitions with myofascial techniques.  She will benefit from skilled PT to continue to address muscle spasms    PT Treatment/Interventions  Biofeedback;Cryotherapy;Electrical Stimulation;Moist Heat;Ultrasound;Therapeutic exercise;Therapeutic activities;Neuromuscular re-education;Patient/family education;Manual techniques;Dry needling;Passive range of motion;Taping    PT Next Visit Plan  internal STM, lumbar and hip fascial release and STM, ROM and stretches and breathing with bulging quadruped, childs pose with pillows    Consulted and Agree with Plan of Care  Patient  Patient will benefit from skilled therapeutic intervention in order to improve the following deficits and  impairments:  Increased fascial restricitons, Pain, Decreased mobility, Increased muscle spasms, Decreased activity tolerance, Decreased endurance, Decreased range of motion, Decreased strength, Impaired flexibility  Visit Diagnosis: Cramp and spasm  Muscle weakness (generalized)     Problem List There are no active problems to display for this patient.   Vincente Poli, PT 08/16/2018, 5:25 PM  [facility] [address], [address] [place], [place], [address] Phone: [phone]   Fax:  [phone]  Name: Tara [place] MRN: [id number] Date of Birth: 04-02-1986

## 2018-08-18 ENCOUNTER — Ambulatory Visit: Payer: BC Managed Care – PPO | Admitting: Physical Therapy

## 2018-08-18 DIAGNOSIS — M6281 Muscle weakness (generalized): Secondary | ICD-10-CM

## 2018-08-18 DIAGNOSIS — R252 Cramp and spasm: Secondary | ICD-10-CM

## 2018-08-18 NOTE — Therapy (Signed)
[facility] [address], [address] [place], [name], [address] Phone: [phone]   Fax:  [phone]  Physical Therapy Treatment  Patient Details  Name: Tara BishopKimberly D Cast[place]eda MRN: [id number] Date of Birth: 12/21/1985 Referring Provider (PT): Dr. Marlin CanaryErica Robinson   Encounter Date: 08/18/2018  PT End of Session - 08/18/18 1723    Visit Number  8    Date for PT Re-Evaluation  10/13/18    Authorization Type  [employer] state health    PT Start Time  1532    PT Stop Time  1620    PT Time Calculation (min)  48 min    Activity Tolerance  Patient tolerated treatment well    Behavior During Therapy  [facility] for tasks assessed/performed       Past Medical History:  Diagnosis Date  . ADD (attention deficit disorder)   . Asthma    MILD - LAST FLARE UP Aug 30, 2017 OFFICE NOTE ON CHART  . DJD (degenerative joint disease)    L 4 TO L5 , ARTHRITIS RIGHT KNEE  . Eczema    MOSTLY ON HANDS - OCCAS PATCHES ELSWHERE  . GERD (gastroesophageal reflux disease)   . History of heartburn   . History of kidney stones   . KP (keratosis pilaris)    UPPER ARMS  . Migraines   . Pain    HX OF STRAINED BACK SINCE ACCIDENT IN CHILDHOOD   . PONV (postoperative nausea and vomiting)    SEVERE  . Scoliosis    SLIGHT  . Seasonal allergies    MOLD AND MILDEW    Past Surgical History:  Procedure Laterality Date  . CHOLECYSTECTOMY  DEC 2004  . CYSTOSCOPY WITH RETROGRADE PYELOGRAM, URETEROSCOPY AND STENT PLACEMENT Right 10/26/2013   Procedure: CYSTOSCOPY WITH RIGHT URETEROSCOPY,  AND RIGHT URETERAL STENT PLACEMENT,  LEFT CYSTOSCOPY WITH RETROGRADE;  Surgeon: Crist FatBenjamin W Herrick, MD;  Location[facility];  Service: Urology;  Laterality: Right;  . EXOSTECTOMY  04/2014   BONE SPUR REMOVED  . HYSTEROSCOPY W/D&C N/A 11/30/2017   Procedure: DILATATION AND CURETTAGE /HYSTEROSCOPY;  Surgeon: Mitchel HonourMorris, Megan, DO;  [facility];  Service: Gynecology;   Laterality: N/A;  . LAPAROSCOPY N/A 11/30/2017   Procedure: LAPAROSCOPY DIAGNOSTIC possible removal of endometriosis;  Surgeon: Mitchel HonourMorris, Megan, DO;  [facility];  Service: Gynecology;  Laterality: N/A;  . LITHOTRIPSY  MAY 2011  . RIGHT KNEE ARTHROSCOPY     JAN 2003 AND MARCH 2004, AND DEC 2004 AND AUGUST 2008    There were no vitals filed for this visit.  Subjective Assessment - 08/18/18 1536    Subjective  I have been better.  I found out I wasn't putting the valium far enough up.  I am not really having any pain right now unless I'm standing for a long time.  I still don't know why my pain is spiked for this long    Patient Stated Goals  reduce pain    Currently in Pain?  No/denies                       [facility] - 08/18/18 0001      Neuro Re-ed    Neuro Re-ed Details   TrA activation and kegel      Exercises   Exercises  Lumbar      Lumbar Exercises: Stretches   Press Ups  5 reps;10 reps  Lumbar Exercises: Supine   Ab Set  10 reps;3 seconds    Bent Knee Raise  20 reps;2 seconds    Other Supine Lumbar Exercises  bent knee drop out - 20x    Other Supine Lumbar Exercises  ball squeeze with kegel 10x 3 sec      Manual Therapy   Manual Therapy  Internal Pelvic Floor    Manual therapy comments  identity confirmed and patient informed and given consent to perfrom internal soft tissue mobilization    Myofascial Release  lower abdominal fascial release               PT Short Term Goals - 07/28/18 1552      PT SHORT TERM GOAL #1   Title  independent with initial HEP    Status  Achieved      PT SHORT TERM GOAL #2   Title  understand ways to manage pelvic pain with meditation, possible home TENS unit, heat, diaphragmatic breathing    Baseline  given education today    Status  On-going        PT Long Term Goals - 07/21/18 1638      PT LONG TERM GOAL #1   Title  independent with HEP and understand  how to progress herself including workout program    Time  12    Period  Weeks    Status  New    Target Date  10/13/18      PT LONG TERM GOAL #2   Title  pain after intercourse is minimal due to decreased pelvic floor muscle tension and using heat    Time  12    Period  Weeks    Status  New    Target Date  10/13/18      PT LONG TERM GOAL #3   Title  pelvic pain minimal to none with standing at work due to reduction of trigger points and patient understands how to release trigger points    Time  12    Period  Weeks    Status  New    Target Date  10/13/18      PT LONG TERM GOAL #4   Title  walking 30 minutes 3 times per week due to abilty to relax the pelvic floor muscles with minimal pain    Time  12    Period  Weeks    Status  New    Target Date  10/13/18      PT LONG TERM GOAL #5   Title  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  Pt demonstrates ability to kegel but difficulty without co-contracting glutes.  Pt was educated in kegel and TrA exercises in order to improve muscle coordination.  Pt will benefit from skilled PT to continue to work on improved soft tissue length and coordination to help with management of abdominal pain.    PT Treatment/Interventions  Biofeedback;Cryotherapy;Electrical Stimulation;Moist Heat;Ultrasound;Therapeutic exercise;Therapeutic activities;Neuromuscular re-education;Patient/family education;Manual techniques;Dry needling;Passive range of motion;Taping    PT Home Exercise Plan  Access Code: [email or web address] - add rotation and  prone press ups, bent knee raises    Consulted and Agree with Plan of Care  Patient       Patient will benefit from skilled therapeutic intervention  in order to improve the following deficits and impairments:  Increased fascial restricitons, Pain, Decreased mobility, Increased muscle spasms, Decreased activity tolerance, Decreased endurance, Decreased range of motion, Decreased strength, Impaired flexibility  Visit Diagnosis: Cramp and spasm  Muscle weakness (generalized)     Problem List There are no active problems to display for this patient.   Vincente Poli, PT 08/18/2018, 5:24 PM  [facility] [address], [address] [place], [place], [address] Phone: [phone]   Fax:  [phone]  Name: KIOWA PEIFER MRN: [id number] Date of Birth: [birth year]

## 2018-08-24 ENCOUNTER — Ambulatory Visit: Payer: BC Managed Care – PPO | Admitting: Physical Therapy

## 2018-08-24 ENCOUNTER — Encounter: Payer: Self-pay | Admitting: Physical Therapy

## 2018-08-24 DIAGNOSIS — R252 Cramp and spasm: Secondary | ICD-10-CM

## 2018-08-24 DIAGNOSIS — M6281 Muscle weakness (generalized): Secondary | ICD-10-CM

## 2018-08-24 NOTE — Patient Instructions (Signed)
Moisturizers . They are used in the vagina to hydrate the mucous membrane that make up the vaginal canal. . Designed to keep a more normal acid balance (ph) . Once placed in the vagina, it will last between two to three days.  . Use 2-3 times per week at bedtime and last longer than 60 min. . Ingredients to avoid is glycerin and fragrance, can increase chance of infection . Should not be used just before sex due to causing irritation . Most are gels administered either in a tampon-shaped applicator or as a vaginal suppository. They are non-hormonal.   Types of Moisturizers . Leatrice JewelsLuvena- drug store . Vitamin E vaginal suppositories- Whole foods, Amazon . Moist Again . Coconut oil- can break down condoms . Julva- (Do no use if on Tamoxifen) amazon . Yes moisturizer- amazon . NeuEve Silk , NeuEve Silver for menopausal or over 65 (if have severe vaginal atrophy or cancer treatments use NeuEve Silk for  1 month than move to Home DepoteuEve Silver)- Dana Corporationmazon, ShapeConsultant.com.cyNeuve.com . Olive and Bee intimate cream- www.oliveandbee.com.au . Mae vaginal moisturizer- Amazon  Creams to use externally on the Vulva area  Marathon OilDesert Harvest Releveum (good for for cancer patients that had radiation to the area)- Guamamazon or Newell Rubbermaidwww.https://garcia-valdez.org/desertharvest.com  V-magic cream - amazon  Julva-amazon  Vital "V Wild Yam salve ( help moisturize and help with thinning vulvar area, does have Beeswax  The KrogerMoodMaid Botanical Pro-Meno Wild Yam Cream- Amazon  Desert Harvest Gele  Cleo by Zane Heraldamiva labial moisturizer (Amazon)   Things to avoid in the vaginal area . Do not use things to irritate the vulvar area . No lotions just specialized creams for the vulva area- Neogyn, V-magic, No soaps; can use Aveeno or Calendula cleanser if needed. Must be gentle . No deodorants . No douches . Good to sleep without underwear to let the vaginal area to air out . No scrubbing: spread the lips to let warm water rinse over labias and pat dry

## 2018-08-24 NOTE — Therapy (Signed)
[facility] [address], [address] [place], [place], [address] Phone: [phone]   Fax:  [phone]  Physical Therapy Treatment  Patient Details  Name: Tara Castaneda MRN: [id number] Date of Birth: 1986-03-28 Referring Provider (PT): Dr. Marlin Canary   Encounter Date: 08/24/2018  PT End of Session - 08/24/18 1626    Visit Number  9    Date for PT Re-Evaluation  10/13/18    Authorization Type  [employer] state [employer]    PT Start Time  1622    PT Stop Time  1718    PT Time Calculation (min)  56 min    Activity Tolerance  Patient tolerated treatment well    Behavior During Therapy  [facility] for tasks assessed/performed       Past Medical History:  Diagnosis Date  . ADD (attention deficit disorder)   . Asthma    MILD - LAST FLARE UP Aug 30, 2017 OFFICE NOTE ON CHART  . DJD (degenerative joint disease)    L 4 TO L5 , ARTHRITIS RIGHT KNEE  . Eczema    MOSTLY ON HANDS - OCCAS PATCHES ELSWHERE  . GERD (gastroesophageal reflux disease)   . History of heartburn   . History of kidney stones   . KP (keratosis pilaris)    UPPER ARMS  . Migraines   . Pain    HX OF STRAINED BACK SINCE ACCIDENT IN CHILDHOOD   . PONV (postoperative nausea and vomiting)    SEVERE  . Scoliosis    SLIGHT  . Seasonal allergies    MOLD AND MILDEW    Past Surgical History:  Procedure Laterality Date  . CHOLECYSTECTOMY  DEC 2004  . CYSTOSCOPY WITH RETROGRADE PYELOGRAM, URETEROSCOPY AND STENT PLACEMENT Right 10/26/2013   Procedure: CYSTOSCOPY WITH RIGHT URETEROSCOPY,  AND RIGHT URETERAL STENT PLACEMENT,  LEFT CYSTOSCOPY WITH RETROGRADE;  Surgeon: Crist Fat, MD;  Location: [facility];  Service: Urology;  Laterality: Right;  . EXOSTECTOMY  04/2014   BONE SPUR REMOVED  . HYSTEROSCOPY W/D&C N/A 11/30/2017   Procedure: DILATATION AND CURETTAGE /HYSTEROSCOPY;  Surgeon: Mitchel Honour, DO;  Location: [facility];  Service: Gynecology;   Laterality: N/A;  . LAPAROSCOPY N/A 11/30/2017   Procedure: LAPAROSCOPY DIAGNOSTIC possible removal of endometriosis;  Surgeon: Mitchel Honour, DO;  Location: [facility];  Service: Gynecology;  Laterality: N/A;  . LITHOTRIPSY  MAY 2011  . RIGHT KNEE ARTHROSCOPY     JAN 2003 AND MARCH 2004, AND DEC 2004 AND AUGUST 2008    There were no vitals filed for this visit.  Subjective Assessment - 08/24/18 1627    Subjective  I have been having a little more pain today, but other than that the massage and valium seem to be working    Patient Stated Goals  reduce pain    Currently in Pain?  Yes    Pain Score  6     Pain Location  Abdomen    Pain Orientation  Lower    Pain Descriptors / Indicators  Cramping    Pain Type  Chronic pain    Pain Onset  More than a month ago    Pain Frequency  Intermittent    Multiple Pain Sites  No                       [facility] Adult PT Treatment/Exercise - 08/24/18 0001      Neuro Re-ed    Neuro  Re-ed Details   biofeedback for relaxation - <657mV; sitting on ball circles and pelvic tilts      Lumbar Exercises: Stretches   Single Knee to Chest Stretch  2 reps;20 seconds   happy baby stretch 2 x 20 sec   Double Knee to Chest Stretch  2 reps;20 seconds    Press Ups  5 reps;10 reps    Prone Mid Back Stretch  3 reps   child's pose with side bending     Lumbar Exercises: Quadruped   [place]  5 reps      Moist Heat Therapy   Number Minutes Moist Heat  15 Minutes    Moist Heat Location  Other (comment)   abdomen     Electrical Stimulation   Electrical Stimulation Location  abdomen    Electrical Stimulation Action  IFC    Electrical Stimulation Parameters  to tolerance    Electrical Stimulation Goals  Pain             PT Education - 08/24/18 1714    Education Details   Access Code: [email or web address] reviewed vaginal moisturizers    Person(s) Educated  Patient    Methods  Explanation;Demonstration;Handout;Verbal cues     Comprehension  Verbalized understanding;Returned demonstration       PT Short Term Goals - 08/24/18 1720      PT SHORT TERM GOAL #2   Title  understand ways to manage pelvic pain with meditation, possible home TENS unit, heat, diaphragmatic breathing    Status  Achieved      PT SHORT TERM GOAL #4   Title  understand perineal massage to relax the pelvic floor and prepare for penile penetration    Status  Achieved        PT Long Term Goals - 08/24/18 1720      PT LONG TERM GOAL #1   Title  independent with HEP and understand how to progress herself including workout program    Status  On-going      PT LONG TERM GOAL #2   Title  pain after intercourse is minimal due to decreased pelvic floor muscle tension and using heat    Status  On-going      PT LONG TERM GOAL #3   Title  pelvic pain minimal to none with standing at work due to reduction of trigger points and patient understands how to release trigger points    Status  On-going      PT LONG TERM GOAL #4   Title  walking 30 minutes 3 times per week due to abilty to relax the pelvic floor muscles with minimal pain    Status  On-going      PT LONG TERM GOAL #5   Title  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  Pt was able to demonstrate breathing technique to assist with even greater muscle release.  Pt will continue to benefit from skilled PT to work on improved muscle tone and reduced pain    PT Treatment/Interventions  Biofeedback;Cryotherapy;Electrical Stimulation;Moist Heat;Ultrasound;Therapeutic exercise;Therapeutic activities;Neuromuscular re-education;Patient/family education;Manual techniques;Dry needling;Passive range of motion;Taping    PT Next Visit Plan  STM or DN to perineal body,  T10-12,  lumbar, internal STM with LE movements, lumbar and hip fascial release and STM, ROM and stretches and breathing with bulging quadruped    PT Home Exercise Plan  Access Code: [email or web address]     Consulted and Agree with Plan of Care  Patient       Patient will benefit from skilled therapeutic intervention in order to improve the following deficits and impairments:  Increased fascial restricitons, Pain, Decreased mobility, Increased muscle spasms, Decreased activity tolerance, Decreased endurance, Decreased range of motion, Decreased strength, Impaired flexibility  Visit Diagnosis: Cramp and spasm  Muscle weakness (generalized)     Problem List There are no active problems to display for this patient.   Vincente Poli, PT 08/24/2018, 5:24 PM  [facility] [address] [place], [place], [address] Phone: [phone]   Fax:  [phone]  Name: Tara Castaneda MRN: [id number] Date of Birth: 09/30/1985

## 2018-08-26 ENCOUNTER — Ambulatory Visit: Payer: BC Managed Care – PPO | Admitting: Physical Therapy

## 2018-08-26 DIAGNOSIS — M6281 Muscle weakness (generalized): Secondary | ICD-10-CM

## 2018-08-26 DIAGNOSIS — R252 Cramp and spasm: Secondary | ICD-10-CM

## 2018-08-26 NOTE — Therapy (Signed)
[facility] [address], [address] [place], [name], [address] Phone: [phone]   Fax:  [phone]  Physical Therapy Treatment  Patient Details  Name: Tara BishopKimberly D Lamm MRN: [id number] Date of Birth: 04/27/1986 Referring Provider (PT): Dr. Marlin CanaryErica Robinson   Encounter Date: 08/26/2018  PT End of Session - 08/26/18 1615    Visit Number  10    Date for PT Re-Evaluation  10/13/18    Authorization Type  [employer] state health    PT Start Time  1545    PT Stop Time  1630    PT Time Calculation (min)  45 min    Activity Tolerance  Patient tolerated treatment well    Behavior During Therapy  [facility] for tasks assessed/performed       Past Medical History:  Diagnosis Date  . ADD (attention deficit disorder)   . Asthma    MILD - LAST FLARE UP Aug 30, 2017 OFFICE NOTE ON CHART  . DJD (degenerative joint disease)    L 4 TO L5 , ARTHRITIS RIGHT KNEE  . Eczema    MOSTLY ON HANDS - OCCAS PATCHES ELSWHERE  . GERD (gastroesophageal reflux disease)   . History of heartburn   . History of kidney stones   . KP (keratosis pilaris)    UPPER ARMS  . Migraines   . Pain    HX OF STRAINED BACK SINCE ACCIDENT IN CHILDHOOD   . PONV (postoperative nausea and vomiting)    SEVERE  . Scoliosis    SLIGHT  . Seasonal allergies    MOLD AND MILDEW    Past Surgical History:  Procedure Laterality Date  . CHOLECYSTECTOMY  DEC 2004  . CYSTOSCOPY WITH RETROGRADE PYELOGRAM, URETEROSCOPY AND STENT PLACEMENT Right 10/26/2013   Procedure: CYSTOSCOPY WITH RIGHT URETEROSCOPY,  AND RIGHT URETERAL STENT PLACEMENT,  LEFT CYSTOSCOPY WITH RETROGRADE;  Surgeon: Crist FatBenjamin W Herrick, MD;  Location: [facility];  Service: Urology;  Laterality: Right;  . EXOSTECTOMY  04/2014   BONE SPUR REMOVED  . HYSTEROSCOPY W/D&C N/A 11/30/2017   Procedure: DILATATION AND CURETTAGE /HYSTEROSCOPY;  Surgeon: Mitchel HonourMorris, Megan, DO;  Location: [facility];  Service: Gynecology;   Laterality: N/A;  . LAPAROSCOPY N/A 11/30/2017   Procedure: LAPAROSCOPY DIAGNOSTIC possible removal of endometriosis;  Surgeon: Mitchel HonourMorris, Megan, DO;  [facility];  Service: Gynecology;  Laterality: N/A;  . LITHOTRIPSY  MAY 2011  . RIGHT KNEE ARTHROSCOPY     JAN 2003 AND MARCH 2004, AND DEC 2004 AND AUGUST 2008    There were no vitals filed for this visit.  Subjective Assessment - 08/26/18 1722    Subjective  I am finally feeling better.  I am just now starting to have a little pain.  Only been using one valium and going to see how it goes without any this weekend.    Patient Stated Goals  reduce pain    Currently in Pain?  Yes    Pain Score  5     Pain Location  Abdomen    Pain Orientation  Lower    Pain Descriptors / Indicators  Cramping    Pain Type  Chronic pain    Pain Onset  More than a month ago    Multiple Pain Sites  No                       [facility] Adult PT Treatment/Exercise - 08/26/18 0001      Moist  Heat Therapy   Number Minutes Moist Heat  15 Minutes    Moist Heat Location  Other (comment)   abdomen     Electrical Stimulation   Electrical Stimulation Location  abdomen    Electrical Stimulation Action  IFC    Electrical Stimulation Parameters  to tolerance    Electrical Stimulation Goals  Pain      Manual Therapy   Manual Therapy  Internal Pelvic Floor    Manual therapy comments  identity confirmed and patient informed and given consent to perfrom internal soft tissue mobilization    Internal Pelvic Floor  ischiocavernosis with LE movements, pubococcygeus, fascial restiction in vaginal canal Rt>Lt               PT Short Term Goals - 08/24/18 1720      PT SHORT TERM GOAL #2   Title  understand ways to manage pelvic pain with meditation, possible home TENS unit, heat, diaphragmatic breathing    Status  Achieved      PT SHORT TERM GOAL #4   Title  understand perineal massage to relax the pelvic floor and prepare  for penile penetration    Status  Achieved        PT Long Term Goals - 08/26/18 1719      PT LONG TERM GOAL #1   Title  independent with HEP and understand how to progress herself including workout program    Status  On-going      PT LONG TERM GOAL #2   Title  pain after intercourse is minimal due to decreased pelvic floor muscle tension and using heat    Status  On-going      PT LONG TERM GOAL #3   Title  pelvic pain minimal to none with standing at work due to reduction of trigger points and patient understands how to release trigger points    Baseline  improving    Status  On-going      PT LONG TERM GOAL #4   Title  walking 30 minutes 3 times per week due to abilty to relax the pelvic floor muscles with minimal pain    Status  On-going      PT LONG TERM GOAL #5   Title  pain 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  Pt has done well with adding core strength to HEP.  Pt is very consistent with HEP and is slowly making progress towards goals of decreased pain.  Pt recommended to continue with POC.    Clinical Impairments Affecting Rehab Potential  Laprascopic surgery on 11/30/2017 with removing endometriosis    PT Treatment/Interventions  Biofeedback;Cryotherapy;Electrical Stimulation;Moist Heat;Ultrasound;Therapeutic exercise;Therapeutic activities;Neuromuscular re-education;Patient/family education;Manual techniques;Dry needling;Passive range of motion;Taping    PT Next Visit Plan  core strength and pelvic alignment, STM or DN to perineal body, T10-12, lumbar, internal STM with LE movements, lumbar and hip fascial release and STM, ROM and stretches and breathing with bulging quadruped    PT Home Exercise Plan  Access Code:  [email or web address]     Consulted and Agree with Plan of Care  Patient       Patient will benefit from skilled therapeutic  intervention in order to improve the following deficits and impairments:  Increased fascial restricitons, Pain, Decreased mobility, Increased muscle spasms, Decreased activity tolerance, Decreased endurance, Decreased range of motion, Decreased strength, Impaired flexibility  Visit Diagnosis: Cramp and spasm  Muscle weakness (generalized)     Problem List There are no active problems to display for this patient.   Vincente PoliJakki Crosser, PT 08/26/2018, 5:27 PM  [facility] [address], [address] [place], [name], [address] Phone: [phone]   Fax:  [phone]  Name: Tara BishopKimberly D Beaubrun MRN: [id number] Date of Birth: 02/21/1986

## 2018-08-30 ENCOUNTER — Encounter: Payer: Self-pay | Admitting: Physical Therapy

## 2018-08-30 ENCOUNTER — Ambulatory Visit: Payer: BC Managed Care – PPO | Admitting: Physical Therapy

## 2018-08-30 DIAGNOSIS — M6281 Muscle weakness (generalized): Secondary | ICD-10-CM

## 2018-08-30 DIAGNOSIS — R252 Cramp and spasm: Secondary | ICD-10-CM | POA: Diagnosis not present

## 2018-08-30 NOTE — Therapy (Signed)
[facility] [address], [address] [place], [name], [address] Phone: [phone]   Fax:  [phone]  Physical Therapy Treatment  Patient Details  Name: Tara BishopKimberly D Ciani MRN: [id number] Date of Birth: 03/03/1986 Referring Provider (PT): Dr. Marlin CanaryErica Robinson   Encounter Date: 08/30/2018  PT End of Session - 08/30/18 1532    Visit Number  11    Date for PT Re-Evaluation  10/13/18    Authorization Type  [employer] state health    PT Start Time  1528    PT Stop Time  1615    PT Time Calculation (min)  47 min    Activity Tolerance  Patient tolerated treatment well    Behavior During Therapy  [facility] for tasks assessed/performed       Past Medical History:  Diagnosis Date  . ADD (attention deficit disorder)   . Asthma    MILD - LAST FLARE UP Aug 30, 2017 OFFICE NOTE ON CHART  . DJD (degenerative joint disease)    L 4 TO L5 , ARTHRITIS RIGHT KNEE  . Eczema    MOSTLY ON HANDS - OCCAS PATCHES ELSWHERE  . GERD (gastroesophageal reflux disease)   . History of heartburn   . History of kidney stones   . KP (keratosis pilaris)    UPPER ARMS  . Migraines   . Pain    HX OF STRAINED BACK SINCE ACCIDENT IN CHILDHOOD   . PONV (postoperative nausea and vomiting)    SEVERE  . Scoliosis    SLIGHT  . Seasonal allergies    MOLD AND MILDEW    Past Surgical History:  Procedure Laterality Date  . CHOLECYSTECTOMY  DEC 2004  . CYSTOSCOPY WITH RETROGRADE PYELOGRAM, URETEROSCOPY AND STENT PLACEMENT Right 10/26/2013   Procedure: CYSTOSCOPY WITH RIGHT URETEROSCOPY,  AND RIGHT URETERAL STENT PLACEMENT,  LEFT CYSTOSCOPY WITH RETROGRADE;  Surgeon: Crist FatBenjamin W Herrick, MD;  Location: [facility];  Service: Urology;  Laterality: Right;  . EXOSTECTOMY  04/2014   BONE SPUR REMOVED  . HYSTEROSCOPY W/D&C N/A 11/30/2017   Procedure: DILATATION AND CURETTAGE /HYSTEROSCOPY;  Surgeon: Mitchel HonourMorris, Megan, DO;  Location: [facility];  Service: Gynecology;   Laterality: N/A;  . LAPAROSCOPY N/A 11/30/2017   Procedure: LAPAROSCOPY DIAGNOSTIC possible removal of endometriosis;  Surgeon: Mitchel HonourMorris, Megan, DO;  [facility];  Service: Gynecology;  Laterality: N/A;  . LITHOTRIPSY  MAY 2011  . RIGHT KNEE ARTHROSCOPY     JAN 2003 AND MARCH 2004, AND DEC 2004 AND AUGUST 2008    There were no vitals filed for this visit.  Subjective Assessment - 08/30/18 1534    Subjective  I had a lot of pain on Saturday night it came on very suddenly and then I had bleeding.  Pt has ultrasound appointment tomrrow to look at the uterus and hopefully discover the cause the the bleeding.    Patient Stated Goals  reduce pain    Currently in Pain?  Yes    Pain Score  6     Pain Location  Abdomen    Pain Orientation  Lower    Pain Descriptors / Indicators  Cramping    Pain Type  Chronic pain    Pain Onset  More than a month ago    Multiple Pain Sites  No                       [facility] Adult PT Treatment/Exercise - 08/30/18 0001  Lumbar Exercises: Standing   Other Standing Lumbar Exercises  wall push ups - 20x      Lumbar Exercises: Supine   Bent Knee Raise  20 reps;2 seconds    Bridge  20 reps;3 seconds    Large Ball Abdominal Isometric  20 reps;1 second   roll out and UE flexion     Moist Heat Therapy   Number Minutes Moist Heat  15 Minutes    Moist Heat Location  Other (comment)      Electrical Stimulation   Electrical Stimulation Location  abdomen    Electrical Stimulation Action  IFC    Electrical Stimulation Parameters  to tolerance    Electrical Stimulation Goals  Pain      Manual Therapy   Soft tissue mobilization  bilateral glutes and lumbar paraspinals               PT Short Term Goals - 08/24/18 1720      PT SHORT TERM GOAL #2   Title  understand ways to manage pelvic pain with meditation, possible home TENS unit, heat, diaphragmatic breathing    Status  Achieved      PT SHORT TERM GOAL #4    Title  understand perineal massage to relax the pelvic floor and prepare for penile penetration    Status  Achieved        PT Long Term Goals - 08/26/18 1719      PT LONG TERM GOAL #1   Title  independent with HEP and understand how to progress herself including workout program    Status  On-going      PT LONG TERM GOAL #2   Title  pain after intercourse is minimal due to decreased pelvic floor muscle tension and using heat    Status  On-going      PT LONG TERM GOAL #3   Title  pelvic pain minimal to none with standing at work due to reduction of trigger points and patient understands how to release trigger points    Baseline  improving    Status  On-going      PT LONG TERM GOAL #4   Title  walking 30 minutes 3 times per week due to abilty to relax the pelvic floor muscles with minimal pain    Status  On-going      PT LONG TERM GOAL #5   Title  pain 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  She responded well to [facility].  After manual treatment pt was able to perform core strengthening exercises wihtout increased pain.  Pt will continue to benefit from skilled PT and monitor symptoms as patient will reutrn to doctor as needed.    PT Treatment/Interventions  Biofeedback;Cryotherapy;Electrical Stimulation;Moist Heat;Ultrasound;Therapeutic exercise;Therapeutic activities;Neuromuscular re-education;Patient/family education;Manual techniques;Dry needling;Passive range of motion;Taping    PT Next Visit Plan  core strength and pelvic alignment, STM or DN to perineal body, T10-12, lumbar, internal STM with LE movements, lumbar and hip fascial release and STM, ROM and stretches and breathing with bulging quadruped    PT Home Exercise Plan  Access Code: [email or web address]     Consulted and Agree  with Plan of Care  Patient  Patient will benefit from skilled therapeutic intervention in order to improve the following deficits and impairments:  Increased fascial restricitons, Pain, Decreased mobility, Increased muscle spasms, Decreased activity tolerance, Decreased endurance, Decreased range of motion, Decreased strength, Impaired flexibility  Visit Diagnosis: Cramp and spasm  Muscle weakness (generalized)     Problem List There are no active problems to display for this patient.  Sallyanne HaversJakki Crosser,PT 08/30/2018, 6:11 PM  [facility] [address], [address] [place], [name], [address] Phone: [phone]   Fax:  [phone]  Name: Tara BishopKimberly D Knick MRN: [id number] Date of Birth: 06/07/1986

## 2018-09-09 ENCOUNTER — Ambulatory Visit: Payer: BC Managed Care – PPO | Attending: Obstetrics & Gynecology | Admitting: Physical Therapy

## 2018-09-09 ENCOUNTER — Encounter: Payer: Self-pay | Admitting: Physical Therapy

## 2018-09-09 DIAGNOSIS — M6281 Muscle weakness (generalized): Secondary | ICD-10-CM | POA: Insufficient documentation

## 2018-09-09 DIAGNOSIS — R252 Cramp and spasm: Secondary | ICD-10-CM | POA: Insufficient documentation

## 2018-09-09 NOTE — Therapy (Signed)
[facility] [address], [address] [place], [name], [address] Phone: [phone]   Fax:  [phone]  Physical Therapy Treatment  Patient Details  Name: Tara Castaneda MRN: [id number] Date of Birth: 10/12/1985 Referring Provider (PT): Dr. Marlin CanaryErica Robinson   Encounter Date: 09/09/2018  PT End of Session - 09/09/18 1536    Visit Number  12    Date for PT Re-Evaluation  10/13/18    Authorization Type  [employer] state health    PT Start Time  1533    PT Stop Time  1617    PT Time Calculation (min)  44 min    Activity Tolerance  Patient tolerated treatment well    Behavior During Therapy  [facility] for tasks assessed/performed       Past Medical History:  Diagnosis Date  . ADD (attention deficit disorder)   . Asthma    MILD - LAST FLARE UP Aug 30, 2017 OFFICE NOTE ON CHART  . DJD (degenerative joint disease)    L 4 TO L5 , ARTHRITIS RIGHT KNEE  . Eczema    MOSTLY ON HANDS - OCCAS PATCHES ELSWHERE  . GERD (gastroesophageal reflux disease)   . History of heartburn   . History of kidney stones   . KP (keratosis pilaris)    UPPER ARMS  . Migraines   . Pain    HX OF STRAINED BACK SINCE ACCIDENT IN CHILDHOOD   . PONV (postoperative nausea and vomiting)    SEVERE  . Scoliosis    SLIGHT  . Seasonal allergies    MOLD AND MILDEW    Past Surgical History:  Procedure Laterality Date  . CHOLECYSTECTOMY  DEC 2004  . CYSTOSCOPY WITH RETROGRADE PYELOGRAM, URETEROSCOPY AND STENT PLACEMENT Right 10/26/2013   Procedure: CYSTOSCOPY WITH RIGHT URETEROSCOPY,  AND RIGHT URETERAL STENT PLACEMENT,  LEFT CYSTOSCOPY WITH RETROGRADE;  Surgeon: Crist FatBenjamin W Herrick, MD;  Location: [facility];  Service: Urology;  Laterality: Right;  . EXOSTECTOMY  04/2014   BONE SPUR REMOVED  . HYSTEROSCOPY W/D&C N/A 11/30/2017   Procedure: DILATATION AND CURETTAGE /HYSTEROSCOPY;  Surgeon: Mitchel HonourMorris, Megan, DO;  Location: [facility];  Service: Gynecology;   Laterality: N/A;  . LAPAROSCOPY N/A 11/30/2017   Procedure: LAPAROSCOPY DIAGNOSTIC possible removal of endometriosis;  Surgeon: Mitchel HonourMorris, Megan, DO;  [facility];  Service: Gynecology;  Laterality: N/A;  . LITHOTRIPSY  MAY 2011  . RIGHT KNEE ARTHROSCOPY     JAN 2003 AND MARCH 2004, AND DEC 2004 AND AUGUST 2008    There were no vitals filed for this visit.  Subjective Assessment - 09/09/18 1538    Subjective  I am still having pain spikes and it is usually associated with bleeding and the US showed I was having a period. The heat and stim calms it down a little but the valium stopped.  The US was not as bad because the muscles have relaxed more.      Patient Stated Goals  reduce pain    Currently in Pain?  Yes   no number given                      [facility] Adult PT Treatment/Exercise - 09/09/18 0001      Moist Heat Therapy   Number Minutes Moist Heat  13 Minutes    Moist Heat Location  --   abdomen     Electrical Stimulation   Electrical Stimulation Location  abdomen (lower)  Electrical Stimulation Action  IFC    Electrical Stimulation Parameters  to tolerance    Electrical Stimulation Goals  Pain      Manual Therapy   Myofascial Release  lower abdomen and sacral release, diaphragm - one hand front and one back in all6 planes               PT Short Term Goals - 08/24/18 1720      PT SHORT TERM GOAL #2   Title  understand ways to manage pelvic pain with meditation, possible home TENS unit, heat, diaphragmatic breathing    Status  Achieved      PT SHORT TERM GOAL #4   Title  understand perineal massage to relax the pelvic floor and prepare for penile penetration    Status  Achieved        PT Long Term Goals - 08/26/18 1719      PT LONG TERM GOAL #1   Title  independent with HEP and understand how to progress herself including workout program    Status  On-going      PT LONG TERM GOAL #2   Title  pain after intercourse  is minimal due to decreased pelvic floor muscle tension and using heat    Status  On-going      PT LONG TERM GOAL #3   Title  pelvic pain minimal to none with standing at work due to reduction of trigger points and patient understands how to release trigger points    Baseline  improving    Status  On-going      PT LONG TERM GOAL #4   Title  walking 30 minutes 3 times per week due to abilty to relax the pelvic floor muscles with minimal pain    Status  On-going      PT LONG TERM GOAL #5   Title  pain 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  She continues to get flare ups which seem related to her periods.  Pt had more pain today so focused on pain management techniques.  Pt will benefit from several more sessions in order to ensure she can manage pain  and muscle spasms for maximum function.    PT Treatment/Interventions  Biofeedback;Cryotherapy;Electrical Stimulation;Moist Heat;Ultrasound;Therapeutic exercise;Therapeutic activities;Neuromuscular re-education;Patient/family education;Manual techniques;Dry needling;Passive range of motion;Taping    PT Next Visit Plan  core strength and pelvic alignment, STM or DN to perineal body, T10-12, lumbar, internal STM with LE movements, lumbar and hip fascial release and STM, ROM and stretches and breathing with bulging quadruped    PT Home Exercise Plan  Access Code: [email or web address]     Consulted and Agree with Plan of Care  Patient       Patient will benefit from skilled therapeutic intervention in order to improve the following deficits and impairments:  Increased fascial restricitons, Pain, Decreased mobility, Increased muscle spasms, Decreased activity tolerance, Decreased endurance, Decreased range of motion, Decreased strength, Impaired flexibility  Visit Diagnosis: Cramp and  spasm  Muscle weakness (generalized)     Problem List There are no active problems to display for this patient.   Vincente PoliJakki Crosser, PT 09/09/2018, 5:28 PM  [facility] [address], [address]  [place], [place], [address] Phone: [phone]   Fax:  [phone]  Name: Tara Castaneda MRN: [id number] Date of Birth: October 25, 1985

## 2018-09-14 ENCOUNTER — Encounter: Payer: BC Managed Care – PPO | Admitting: Physical Therapy

## 2018-09-16 ENCOUNTER — Encounter: Payer: BC Managed Care – PPO | Admitting: Physical Therapy

## 2018-09-22 ENCOUNTER — Encounter: Payer: Self-pay | Admitting: Physical Therapy

## 2018-09-22 ENCOUNTER — Ambulatory Visit: Payer: BC Managed Care – PPO | Admitting: Physical Therapy

## 2018-09-22 DIAGNOSIS — M6281 Muscle weakness (generalized): Secondary | ICD-10-CM

## 2018-09-22 DIAGNOSIS — R252 Cramp and spasm: Secondary | ICD-10-CM | POA: Diagnosis not present

## 2018-09-22 NOTE — Therapy (Addendum)
[facility] [address], [address] [place], [place], [address] Phone: [phone]   Fax:  [phone]  Physical Therapy Treatment  Patient Details  Name: Tara Castaneda MRN: [id number] Date of Birth: [birth year] Referring Provider (PT): Dr. Marlin Canary   Encounter Date: 09/22/2018  PT End of Session - 09/22/18 1534    Visit Number  13    Date for PT Re-Evaluation  10/13/18    Authorization Type  [employer] state health    PT Start Time  1533    PT Stop Time  1615    PT Time Calculation (min)  42 min    Activity Tolerance  Patient tolerated treatment well    Behavior During Therapy  [facility] for tasks assessed/performed       Past Medical History:  Diagnosis Date  . ADD (attention deficit disorder)   . Asthma    MILD - LAST FLARE UP Aug 30, 2017 OFFICE NOTE ON CHART  . DJD (degenerative joint disease)    L 4 TO L5 , ARTHRITIS RIGHT KNEE  . Eczema    MOSTLY ON HANDS - OCCAS PATCHES ELSWHERE  . GERD (gastroesophageal reflux disease)   . History of heartburn   . History of kidney stones   . KP (keratosis pilaris)    UPPER ARMS  . Migraines   . Pain    HX OF STRAINED BACK SINCE ACCIDENT IN CHILDHOOD   . PONV (postoperative nausea and vomiting)    SEVERE  . Scoliosis    SLIGHT  . Seasonal allergies    MOLD AND MILDEW    Past Surgical History:  Procedure Laterality Date  . CHOLECYSTECTOMY  DEC 2004  . CYSTOSCOPY WITH RETROGRADE PYELOGRAM, URETEROSCOPY AND STENT PLACEMENT Right 10/26/2013   Procedure: CYSTOSCOPY WITH RIGHT URETEROSCOPY,  AND RIGHT URETERAL STENT PLACEMENT,  LEFT CYSTOSCOPY WITH RETROGRADE;  Surgeon: Crist Fat, MD;  Location: [facility];  Service: Urology;  Laterality: Right;  . EXOSTECTOMY  04/2014   BONE SPUR REMOVED  . HYSTEROSCOPY W/D&C N/A 11/30/2017   Procedure: DILATATION AND CURETTAGE /HYSTEROSCOPY;  Surgeon: Mitchel Honour, DO;  Location: [facility];  Service: Gynecology;   Laterality: N/A;  . LAPAROSCOPY N/A 11/30/2017   Procedure: LAPAROSCOPY DIAGNOSTIC possible removal of endometriosis;  Surgeon: Mitchel Honour, DO;  Location: [facility];  Service: Gynecology;  Laterality: N/A;  . LITHOTRIPSY  MAY 2011  . RIGHT KNEE ARTHROSCOPY     JAN 2003 AND MARCH 2004, AND DEC 2004 AND AUGUST 2008    There were no vitals filed for this visit.  Subjective Assessment - 09/22/18 1613    Subjective  No pain just cramping because I am bleeding after getting the IUD out. Denies any pain    Patient Stated Goals  reduce pain    Currently in Pain?  No/denies                       [facility] Adult PT Treatment/Exercise - 09/22/18 0001      Moist Heat Therapy   Number Minutes Moist Heat  12 Minutes    Moist Heat Location  Lumbar Spine      Electrical Stimulation   Electrical Stimulation Location  low back    Electrical Stimulation Action  IFC    Electrical Stimulation Parameters  to tolerance    Electrical Stimulation Goals  Pain      Manual Therapy   Soft tissue mobilization  bilateral glutes  and lumbar paraspinals, thoracic paraspinals       Trigger Point Dry Needling - 09/22/18 1610    Consent Given?  Yes    Education Handout Provided  Yes    Muscles Treated Upper Body  --   T10-12 multifidi, lumbar multifidi          PT Education - 09/22/18 1614    Education Details  DN aftercare    Person(s) Educated  Patient    Methods  Explanation;Handout    Comprehension  Verbalized understanding       PT Short Term Goals - 08/24/18 1720      PT SHORT TERM GOAL #2   Title  understand ways to manage pelvic pain with meditation, possible home TENS unit, heat, diaphragmatic breathing    Status  Achieved      PT SHORT TERM GOAL #4   Title  understand perineal massage to relax the pelvic floor and prepare for penile penetration    Status  Achieved        PT Long Term Goals - 09/22/18 1605      PT LONG TERM GOAL #1   Title   independent with HEP and understand how to progress herself including workout program    Status  On-going      PT LONG TERM GOAL #2   Title  pain after intercourse is minimal due to decreased pelvic floor muscle tension and using heat    Status  On-going      PT LONG TERM GOAL #3   Title  pelvic pain minimal to none with standing at work due to reduction of trigger points and patient understands how to release trigger points    Baseline  better now that IUD is out, no intense pain    Status  Achieved      PT LONG TERM GOAL #4   Title  walking 30 minutes 3 times per week due to abilty to relax the pelvic floor muscles with minimal pain    Status  On-going      PT LONG TERM GOAL #5   Title  pain 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  Pt only having some cramping due to bleeding.  Pt responded well to [facility] with DN for reduced muscle spasms througout lower thoracic and lumbar spine.  Pt will benefit from one more visit to ensure successful transition to HEP and ensure there are no more flareups of the pain.    PT Treatment/Interventions  Biofeedback;Cryotherapy;Electrical Stimulation;Moist Heat;Ultrasound;Therapeutic exercise;Therapeutic activities;Neuromuscular re-education;Patient/family education;Manual techniques;Dry needling;Passive range of motion;Taping    PT Next Visit Plan  f/u on DN and finalize HEP    PT Home Exercise Plan  Access Code: [email or web address]     Consulted and Agree with Plan of Care  Patient       Patient will benefit from skilled therapeutic intervention in order to improve the following deficits and impairments:  Increased fascial restricitons, Pain, Decreased mobility, Increased muscle spasms, Decreased activity tolerance, Decreased endurance,  Decreased range of motion, Decreased strength, Impaired flexibility  Visit Diagnosis: Cramp and spasm  Muscle weakness (generalized)     Problem List There  are no active problems to display for this patient.   Vincente PoliJakki Crosser, PT 09/22/2018, 4:51 PM  [facility] [address], [address] [place], [name], [address] Phone: [phone]   Fax:  [phone]  Name: Lyman BishopKimberly D Glomb MRN: [id number] Date of Birth: 03/11/1986

## 2018-09-22 NOTE — Patient Instructions (Signed)

## 2018-10-05 ENCOUNTER — Encounter: Payer: BC Managed Care – PPO | Admitting: Physical Therapy

## 2018-10-11 ENCOUNTER — Ambulatory Visit: Payer: BC Managed Care – PPO | Attending: Obstetrics & Gynecology | Admitting: Physical Therapy

## 2018-10-11 DIAGNOSIS — R252 Cramp and spasm: Secondary | ICD-10-CM | POA: Diagnosis not present

## 2018-10-11 DIAGNOSIS — M6281 Muscle weakness (generalized): Secondary | ICD-10-CM | POA: Insufficient documentation

## 2018-10-11 NOTE — Therapy (Signed)
[facility] [address] [place], [place], [address] Phone: [phone]   Fax:  [phone]  Physical Therapy Treatment  Patient Details  Name: Tara Castaneda MRN: [id number] Date of Birth: February 21, 1986 Referring Provider (PT): Dr. Marlin Canary   Encounter Date: 10/11/2018  PT End of Session - 10/11/18 1617    Visit Number  14    Date for PT Re-Evaluation  10/13/18    Authorization Type  [employer] state health    PT Start Time  1616    PT Stop Time  1700    PT Time Calculation (min)  44 min    Activity Tolerance  Patient tolerated treatment well    Behavior During Therapy  [facility] for tasks assessed/performed       Past Medical History:  Diagnosis Date  . ADD (attention deficit disorder)   . Asthma    MILD - LAST FLARE UP Aug 30, 2017 OFFICE NOTE ON CHART  . DJD (degenerative joint disease)    L 4 TO L5 , ARTHRITIS RIGHT KNEE  . Eczema    MOSTLY ON HANDS - OCCAS PATCHES ELSWHERE  . GERD (gastroesophageal reflux disease)   . History of heartburn   . History of kidney stones   . KP (keratosis pilaris)    UPPER ARMS  . Migraines   . Pain    HX OF STRAINED BACK SINCE ACCIDENT IN CHILDHOOD   . PONV (postoperative nausea and vomiting)    SEVERE  . Scoliosis    SLIGHT  . Seasonal allergies    MOLD AND MILDEW    Past Surgical History:  Procedure Laterality Date  . CHOLECYSTECTOMY  DEC 2004  . CYSTOSCOPY WITH RETROGRADE PYELOGRAM, URETEROSCOPY AND STENT PLACEMENT Right 10/26/2013   Procedure: CYSTOSCOPY WITH RIGHT URETEROSCOPY,  AND RIGHT URETERAL STENT PLACEMENT,  LEFT CYSTOSCOPY WITH RETROGRADE;  Surgeon: Crist Fat, MD;  Location: [facility];  Service: Urology;  Laterality: Right;  . EXOSTECTOMY  04/2014   BONE SPUR REMOVED  . HYSTEROSCOPY W/D&C N/A 11/30/2017   Procedure: DILATATION AND CURETTAGE /HYSTEROSCOPY;  Surgeon: Mitchel Honour, DO;  Location: [facility];  Service: Gynecology;   Laterality: N/A;  . LAPAROSCOPY N/A 11/30/2017   Procedure: LAPAROSCOPY DIAGNOSTIC possible removal of endometriosis;  Surgeon: Mitchel Honour, DO;  Location: [facility];  Service: Gynecology;  Laterality: N/A;  . LITHOTRIPSY  MAY 2011  . RIGHT KNEE ARTHROSCOPY     JAN 2003 AND MARCH 2004, AND DEC 2004 AND AUGUST 2008    There were no vitals filed for this visit.  Subjective Assessment - 10/11/18 1618    Subjective  I feel like my uterus is pulling down and I am having more back pain. Pain most days from 5-8/10.  Maybe a couple of days without pain. Pt states she is feeling a lot more pain the last couple of weeks.  She states it is different than when the IUD is in. States it feels more like heaviness of the uterus, like it is lower or pulling down.    Currently in Pain?  Yes    Pain Score  6     Pain Location  Abdomen    Pain Orientation  Lower    Pain Descriptors / Indicators  Dull;Heaviness;Nagging    Pain Type  Chronic pain    Pain Radiating Towards  no radiating    Pain Onset  More than a month ago    Pain Frequency  Intermittent    Aggravating Factors   standing on my feet    Pain Relieving Factors  sitting    Multiple Pain Sites  No         [facility] PT Assessment - 10/11/18 0001      Assessment   Medical Diagnosis  R10.2 Pelvic Pain    Referring Provider (PT)  Dr. Marlin CanaryErica Robinson    Onset Date/Surgical Date  02/06/18                Pelvic Floor Special Questions - 10/11/18 0001    Biofeedback  contract and hold x 3 sec in prone and supine, rest x6-8 seconds    Biofeedback sensor type  Surface        [facility] Adult PT Treatment/Exercise - 10/11/18 0001      Manual Therapy   Manual therapy comments  identity confirmed and patient informed and given consent to perfrom internal soft tissue mobilization    Internal Pelvic Floor  bulbocavernosis and obdurator internus Lt>Rt               PT Short Term Goals - 08/24/18 1720      PT SHORT  TERM GOAL #2   Title  understand ways to manage pelvic pain with meditation, possible home TENS unit, heat, diaphragmatic breathing    Status  Achieved      PT SHORT TERM GOAL #4   Title  understand perineal massage to relax the pelvic floor and prepare for penile penetration    Status  Achieved        PT Long Term Goals - 10/11/18 1624      PT LONG TERM GOAL #1   Title  independent with HEP and understand how to progress herself including workout program    Time  12    Period  Weeks    Status  On-going    Target Date  01/03/19      PT LONG TERM GOAL #2   Title  pain after intercourse is minimal due to decreased pelvic floor muscle tension and using heat    Baseline  marinoff 3/3    Time  12    Period  Weeks    Status  On-going    Target Date  01/03/19      PT LONG TERM GOAL #4   Title  walking 30 minutes 3 times per week due to abilty to relax the pelvic floor muscles with minimal pain    Baseline  can walk 15 minutes around class and it starts hurting    Time  12    Period  Weeks    Status  On-going    Target Date  01/03/19            Plan - 10/11/18 1658    Clinical Impression Statement  Pt is still having increased pain.  Re-assessed pelvic floor and she is having some difficutly maintaining contraction >3 seconds.  She needs cues to engage only pelvic floor muscles.  Pt had tenderness and tension of bulbocavernosis and obdurator muscles that released with STM  Pt will benefit from skilled PT to improve soft tissue length, endurance, and muscle coordination for greatest functional outcomes.    Rehab Potential  Excellent    PT Frequency  Biweekly    PT Duration  12 weeks    PT Treatment/Interventions  Biofeedback;Cryotherapy;Electrical Stimulation;Moist Heat;Ultrasound;Therapeutic exercise;Therapeutic activities;Neuromuscular re-education;Patient/family education;Manual techniques;Dry needling;Passive range of motion;Taping    PT Next Visit Plan  f/u  on self  massage and kegel with increased endurance, assess patients symptom response, dry needle to lumbar and thoracic multifidi as needed    PT Home Exercise Plan  Access Code: [email or web address]     Consulted and Agree with Plan of Care  Patient       Patient will benefit from skilled therapeutic intervention in order to improve the following deficits and impairments:  Increased fascial restricitons, Pain, Decreased mobility, Increased muscle spasms, Decreased activity tolerance, Decreased endurance, Decreased range of motion, Decreased strength, Impaired flexibility  Visit Diagnosis: Cramp and spasm - Plan: PT plan of care cert/re-cert  Muscle weakness (generalized) - Plan: PT plan of care cert/re-cert     Problem List There are no active problems to display for this patient.   Sallyanne Havers Crosser,PT 10/11/2018, 5:40 PM  [facility] [address], [address] [place], [place], [address] Phone: [phone]   Fax:  [phone]  Name: Tara Castaneda MRN: [id number] Date of Birth: [birth year]

## 2018-10-11 NOTE — Addendum Note (Signed)
Addended by: Dorie Rank D on: 10/11/2018 05:41 PM   Modules accepted: Orders

## 2018-10-11 NOTE — Therapy (Signed)
[facility] [address] [place], [place], [address] Phone: [phone]   Fax:  [phone]  Physical Therapy Treatment  Patient Details  Name: Tara Castaneda MRN: [id number] Date of Birth: Oct 06, 1985 Referring Provider (PT): Dr. Marlin Canary   Encounter Date: 10/11/2018  PT End of Session - 10/11/18 1617    Visit Number  14    Date for PT Re-Evaluation  10/13/18    Authorization Type  [employer]    PT Start Time  1616    PT Stop Time  1700    PT Time Calculation (min)  44 min    Activity Tolerance  Patient tolerated treatment well    Behavior During Therapy  [facility] for tasks assessed/performed       Past Medical History:  Diagnosis Date  . ADD (attention deficit disorder)   . Asthma    MILD - LAST FLARE UP Aug 30, 2017 OFFICE NOTE ON CHART  . DJD (degenerative joint disease)    L 4 TO L5 , ARTHRITIS RIGHT KNEE  . Eczema    MOSTLY ON HANDS - OCCAS PATCHES ELSWHERE  . GERD (gastroesophageal reflux disease)   . History of heartburn   . History of kidney stones   . KP (keratosis pilaris)    UPPER ARMS  . Migraines   . Pain    HX OF STRAINED BACK SINCE ACCIDENT IN CHILDHOOD   . PONV (postoperative nausea and vomiting)    SEVERE  . Scoliosis    SLIGHT  . Seasonal allergies    MOLD AND MILDEW    Past Surgical History:  Procedure Laterality Date  . CHOLECYSTECTOMY  DEC 2004  . CYSTOSCOPY WITH RETROGRADE PYELOGRAM, URETEROSCOPY AND STENT PLACEMENT Right 10/26/2013   Procedure: CYSTOSCOPY WITH RIGHT URETEROSCOPY,  AND RIGHT URETERAL STENT PLACEMENT,  LEFT CYSTOSCOPY WITH RETROGRADE;  Surgeon: Crist Fat, MD;  Location: [facility];  Service: Urology;  Laterality: Right;  . EXOSTECTOMY  04/2014   BONE SPUR REMOVED  . HYSTEROSCOPY W/D&C N/A 11/30/2017   Procedure: DILATATION AND CURETTAGE /HYSTEROSCOPY;  Surgeon: Mitchel Honour, DO;  Location: [facility];  Service: Gynecology;   Laterality: N/A;  . LAPAROSCOPY N/A 11/30/2017   Procedure: LAPAROSCOPY DIAGNOSTIC possible removal of endometriosis;  Surgeon: Mitchel Honour, DO;  Location: [facility];  Service: Gynecology;  Laterality: N/A;  . LITHOTRIPSY  MAY 2011  . RIGHT KNEE ARTHROSCOPY     JAN 2003 AND MARCH 2004, AND DEC 2004 AND AUGUST 2008    There were no vitals filed for this visit.  Subjective Assessment - 10/11/18 1618    Subjective  I feel like my uterus is pulling down and I am having more back pain. Pain most days from 5-8/10.  Maybe a couple of days without pain. Pt states she is feeling a lot more pain the last couple of weeks.  She states it is different than when the IUD is in. States it feels more like heaviness of the uterus, like it is lower or pulling down.    Currently in Pain?  Yes    Pain Score  6     Pain Location  Abdomen    Pain Orientation  Lower    Pain Descriptors / Indicators  Dull;Heaviness;Nagging    Pain Type  Chronic pain    Pain Radiating Towards  no radiating    Pain Onset  More than a month ago    Pain Frequency  Intermittent    Aggravating Factors   standing on my feet    Pain Relieving Factors  sitting    Multiple Pain Sites  No                    Pelvic Floor Special Questions - 10/11/18 0001    Biofeedback  contract and hold x 3 sec in prone and supine, rest x6-8 seconds    Biofeedback sensor type  Surface        [facility] Adult PT Treatment/Exercise - 10/11/18 0001      Manual Therapy   Manual therapy comments  identity confirmed and patient informed and given consent to perfrom internal soft tissue mobilization    Internal Pelvic Floor  bulbocavernosis and obdurator internus Lt>Rt               PT Short Term Goals - 08/24/18 1720      PT SHORT TERM GOAL #2   Title  understand ways to manage pelvic pain with meditation, possible home TENS unit, heat, diaphragmatic breathing    Status  Achieved      PT SHORT TERM GOAL #4    Title  understand perineal massage to relax the pelvic floor and prepare for penile penetration    Status  Achieved        PT Long Term Goals - 10/11/18 1624      PT LONG TERM GOAL #1   Title  independent with HEP and understand how to progress herself including workout program    Time  12    Period  Weeks    Status  On-going    Target Date  01/03/19      PT LONG TERM GOAL #2   Title  pain after intercourse is minimal due to decreased pelvic floor muscle tension and using heat    Baseline  marinoff 3/3    Time  12    Period  Weeks    Status  On-going    Target Date  01/03/19      PT LONG TERM GOAL #4   Title  walking 30 minutes 3 times per week due to abilty to relax the pelvic floor muscles with minimal pain    Baseline  can walk 15 minutes around class and it starts hurting    Time  12    Period  Weeks    Status  On-going    Target Date  01/03/19            Plan - 10/11/18 1658    Clinical Impression Statement  Pt is still having increased pain.  Re-assessed pelvic floor and she is having some difficutly maintaining contraction >3 seconds.  She needs cues to engage only pelvic floor muscles.  Pt had tenderness and tension of bulbocavernosis and obdurator muscles that released with STM  Pt will benefit from skilled PT to improve soft tissue length, endurance, and muscle coordination for greatest functional outcomes.    Rehab Potential  Excellent    PT Frequency  Biweekly    PT Duration  12 weeks    PT Treatment/Interventions  Biofeedback;Cryotherapy;Electrical Stimulation;Moist Heat;Ultrasound;Therapeutic exercise;Therapeutic activities;Neuromuscular re-education;Patient/family education;Manual techniques;Dry needling;Passive range of motion;Taping    PT Next Visit Plan  f/u on self massage and kegel with increased endurance, assess patients symptom response, dry needle to lumbar and thoracic multifidi as needed    PT Home Exercise Plan  Access Code: [email or web address]      Consulted and Agree with Plan  of Care  Patient       Patient will benefit from skilled therapeutic intervention in order to improve the following deficits and impairments:  Increased fascial restricitons, Pain, Decreased mobility, Increased muscle spasms, Decreased activity tolerance, Decreased endurance, Decreased range of motion, Decreased strength, Impaired flexibility  Visit Diagnosis: Cramp and spasm  Muscle weakness (generalized)     Problem List There are no active problems to display for this patient.   Vincente PoliJakki Crosser, PT 10/11/2018, 5:37 PM  [facility] [address] [place], [name], [address] Phone: [phone]   Fax:  [phone]  Name: Tara Castaneda MRN: [id number] Date of Birth: 03/27/1986

## 2018-10-25 ENCOUNTER — Ambulatory Visit: Payer: BC Managed Care – PPO | Admitting: Physical Therapy

## 2018-10-25 DIAGNOSIS — M6281 Muscle weakness (generalized): Secondary | ICD-10-CM

## 2018-10-25 DIAGNOSIS — R252 Cramp and spasm: Secondary | ICD-10-CM | POA: Diagnosis not present

## 2018-10-25 NOTE — Therapy (Signed)
[facility] [address], [address] [place], [place], [address] Phone: [phone]   Fax:  [phone]  Physical Therapy Treatment  Patient Details  Name: Tara Castaneda MRN: [id number] Date of Birth: 1986-03-28 Referring Provider (PT): Dr. Marlin Canary   Encounter Date: 10/25/2018  PT End of Session - 10/25/18 1532    Visit Number  15    Date for PT Re-Evaluation  01/03/19    Authorization Type  [employer] state health    PT Start Time  1532    PT Stop Time  1620    PT Time Calculation (min)  48 min    Activity Tolerance  Patient tolerated treatment well    Behavior During Therapy  [facility] for tasks assessed/performed       Past Medical History:  Diagnosis Date  . ADD (attention deficit disorder)   . Asthma    MILD - LAST FLARE UP Aug 30, 2017 OFFICE NOTE ON CHART  . DJD (degenerative joint disease)    L 4 TO L5 , ARTHRITIS RIGHT KNEE  . Eczema    MOSTLY ON HANDS - OCCAS PATCHES ELSWHERE  . GERD (gastroesophageal reflux disease)   . History of heartburn   . History of kidney stones   . KP (keratosis pilaris)    UPPER ARMS  . Migraines   . Pain    HX OF STRAINED BACK SINCE ACCIDENT IN CHILDHOOD   . PONV (postoperative nausea and vomiting)    SEVERE  . Scoliosis    SLIGHT  . Seasonal allergies    MOLD AND MILDEW    Past Surgical History:  Procedure Laterality Date  . CHOLECYSTECTOMY  DEC 2004  . CYSTOSCOPY WITH RETROGRADE PYELOGRAM, URETEROSCOPY AND STENT PLACEMENT Right 10/26/2013   Procedure: CYSTOSCOPY WITH RIGHT URETEROSCOPY,  AND RIGHT URETERAL STENT PLACEMENT,  LEFT CYSTOSCOPY WITH RETROGRADE;  Surgeon: Crist Fat, MD;  Location: [facility];  Service: Urology;  Laterality: Right;  . EXOSTECTOMY  04/2014   BONE SPUR REMOVED  . HYSTEROSCOPY W/D&C N/A 11/30/2017   Procedure: DILATATION AND CURETTAGE /HYSTEROSCOPY;  Surgeon: Mitchel Honour, DO;  [facility];  Service: Gynecology;   Laterality: N/A;  . LAPAROSCOPY N/A 11/30/2017   Procedure: LAPAROSCOPY DIAGNOSTIC possible removal of endometriosis;  Surgeon: Mitchel Honour, DO;  Location: [facility];  Service: Gynecology;  Laterality: N/A;  . LITHOTRIPSY  MAY 2011  . RIGHT KNEE ARTHROSCOPY     JAN 2003 AND MARCH 2004, AND DEC 2004 AND AUGUST 2008    There were no vitals filed for this visit.  Subjective Assessment - 10/25/18 1546    Subjective  I feel like the pain has been like a 4/10 until today.  Today I feel like I am having cramps and about to get my period.  There were some days last week that I could walk around the class for the entire lesson.    Patient Stated Goals  reduce pain    Currently in Pain?  Yes    Pain Score  7     Pain Location  Abdomen    Pain Orientation  Lower    Pain Descriptors / Indicators  Aching;Cramping    Pain Type  Chronic pain    Pain Onset  More than a month ago    Pain Frequency  Intermittent    Aggravating Factors   standing and on my feet makes it worse    Multiple Pain Sites  No  Pelvic Floor Special Questions - 10/25/18 0001    Biofeedback  contract and hold x 3 sec in prone and supine and sidlying; graduated increase and decreased with step up and step down muscle training    Biofeedback sensor type  Surface        [facility] Adult PT Treatment/Exercise - 10/25/18 0001      Moist Heat Therapy   Number Minutes Moist Heat  10 Minutes    Moist Heat Location  --   abdomen     Electrical Stimulation   Electrical Stimulation Location  abdomen    Electrical Stimulation Action  IFC    Electrical Stimulation Parameters  to tolerance    Electrical Stimulation Goals  Pain               PT Short Term Goals - 08/24/18 1720      PT SHORT TERM GOAL #2   Title  understand ways to manage pelvic pain with meditation, possible home TENS unit, heat, diaphragmatic breathing    Status  Achieved      PT SHORT TERM GOAL #4    Title  understand perineal massage to relax the pelvic floor and prepare for penile penetration    Status  Achieved        PT Long Term Goals - 10/11/18 1624      PT LONG TERM GOAL #1   Title  independent with HEP and understand how to progress herself including workout program    Time  12    Period  Weeks    Status  On-going    Target Date  01/03/19      PT LONG TERM GOAL #2   Title  pain after intercourse is minimal due to decreased pelvic floor muscle tension and using heat    Baseline  marinoff 3/3    Time  12    Period  Weeks    Status  On-going    Target Date  01/03/19      PT LONG TERM GOAL #4   Title  walking 30 minutes 3 times per week due to abilty to relax the pelvic floor muscles with minimal pain    Baseline  can walk 15 minutes around class and it starts hurting    Time  12    Period  Weeks    Status  On-going    Target Date  01/03/19            Plan - 10/25/18 1601    Clinical Impression Statement  Pt did better during the last couple of weeks and was able to tolerated being up fo ra little longer.  Pt did well with biofeedback and was able to demonstrate improved graduated step up and down. She was having intense pain in lower abdomen and started to have more cramping at the end of the treatment.  Stim done at end for pain relief. Will benefit from continueing to work on pelvic floor muscle control.    PT Treatment/Interventions  Biofeedback;Cryotherapy;Electrical Stimulation;Moist Heat;Ultrasound;Therapeutic exercise;Therapeutic activities;Neuromuscular re-education;Patient/family education;Manual techniques;Dry needling;Passive range of motion;Taping    PT Next Visit Plan  f/u on muscle control kegel with increased endurance, assess patients symptom response, dry needle to lumbar and thoracic multifidi as needed    PT Home Exercise Plan  Access Code: [email or web address]     Consulted and Agree with Plan of Care  Patient       Patient will benefit from skilled  therapeutic intervention in order to improve  the following deficits and impairments:  Increased fascial restricitons, Pain, Decreased mobility, Increased muscle spasms, Decreased activity tolerance, Decreased endurance, Decreased range of motion, Decreased strength, Impaired flexibility  Visit Diagnosis: Cramp and spasm  Muscle weakness (generalized)     Problem List There are no active problems to display for this patient.   Vincente PoliJakki Crosser, PT 10/25/2018, 5:29 PM  [facility] [address], [address] [place], [name], [address] Phone: [phone]   Fax:  [phone]  Name: Lyman BishopKimberly D Girgis MRN: [id number] Date of Birth: 06/07/1986

## 2018-11-10 ENCOUNTER — Encounter: Payer: Self-pay | Admitting: Physical Therapy

## 2018-11-10 ENCOUNTER — Ambulatory Visit: Payer: BC Managed Care – PPO | Attending: Obstetrics & Gynecology | Admitting: Physical Therapy

## 2018-11-10 DIAGNOSIS — M6281 Muscle weakness (generalized): Secondary | ICD-10-CM | POA: Insufficient documentation

## 2018-11-10 DIAGNOSIS — R252 Cramp and spasm: Secondary | ICD-10-CM | POA: Insufficient documentation

## 2018-11-10 NOTE — Therapy (Signed)
[facility] [address], [address] [place], [place], [address] Phone: [phone]   Fax:  [phone]  Physical Therapy Treatment  Patient Details  Name: Tara Castaneda MRN: [id number] Date of Birth: 04-06-1986 Referring Provider (PT): Dr. Marlin Canary   Encounter Date: 11/10/2018  PT End of Session - 11/10/18 1615    Visit Number  16    Date for PT Re-Evaluation  01/03/19    Authorization Type  [employer] state health    PT Start Time  1615    PT Stop Time  1700    PT Time Calculation (min)  45 min    Activity Tolerance  Patient tolerated treatment well    Behavior During Therapy  [facility] for tasks assessed/performed       Past Medical History:  Diagnosis Date  . ADD (attention deficit disorder)   . Asthma    MILD - LAST FLARE UP Aug 30, 2017 OFFICE NOTE ON CHART  . DJD (degenerative joint disease)    L 4 TO L5 , ARTHRITIS RIGHT KNEE  . Eczema    MOSTLY ON HANDS - OCCAS PATCHES ELSWHERE  . GERD (gastroesophageal reflux disease)   . History of heartburn   . History of kidney stones   . KP (keratosis pilaris)    UPPER ARMS  . Migraines   . Pain    HX OF STRAINED BACK SINCE ACCIDENT IN CHILDHOOD   . PONV (postoperative nausea and vomiting)    SEVERE  . Scoliosis    SLIGHT  . Seasonal allergies    MOLD AND MILDEW    Past Surgical History:  Procedure Laterality Date  . CHOLECYSTECTOMY  DEC 2004  . CYSTOSCOPY WITH RETROGRADE PYELOGRAM, URETEROSCOPY AND STENT PLACEMENT Right 10/26/2013   Procedure: CYSTOSCOPY WITH RIGHT URETEROSCOPY,  AND RIGHT URETERAL STENT PLACEMENT,  LEFT CYSTOSCOPY WITH RETROGRADE;  Surgeon: Crist Fat, MD;  Location: [facility];  Service: Urology;  Laterality: Right;  . EXOSTECTOMY  04/2014   BONE SPUR REMOVED  . HYSTEROSCOPY W/D&C N/A 11/30/2017   Procedure: DILATATION AND CURETTAGE /HYSTEROSCOPY;  Surgeon: Mitchel Honour, DO;  Location: [facility];  Service: Gynecology;   Laterality: N/A;  . LAPAROSCOPY N/A 11/30/2017   Procedure: LAPAROSCOPY DIAGNOSTIC possible removal of endometriosis;  Surgeon: Mitchel Honour, DO;  Location: [facility];  Service: Gynecology;  Laterality: N/A;  . LITHOTRIPSY  MAY 2011  . RIGHT KNEE ARTHROSCOPY     JAN 2003 AND MARCH 2004, AND DEC 2004 AND AUGUST 2008    There were no vitals filed for this visit.  Subjective Assessment - 11/10/18 1619    Subjective  The pain has been miserable the last 2 weeks.  Today it is better.    Currently in Pain?  Yes    Pain Score  6     Pain Location  Abdomen    Pain Orientation  Lower    Pain Descriptors / Indicators  Aching    Pain Type  Chronic pain    Pain Onset  More than a month ago                       [facility] Adult PT Treatment/Exercise - 11/10/18 0001      Self-Care   Other Self-Care Comments   sitting on foam noodle for pelvic floor relax      Lumbar Exercises: Quadruped   Other Quadruped Lumbar Exercises  TrA and pelvic floor hold in  qped; then in modified qped on forearms      Moist Heat Therapy   Number Minutes Moist Heat  10 Minutes    Moist Heat Location  --   abdomen     Electrical Stimulation   Electrical Stimulation Location  abdomen    Electrical Stimulation Action  IFC    Electrical Stimulation Parameters  to tolerance    Electrical Stimulation Goals  Pain               PT Short Term Goals - 08/24/18 1720      PT SHORT TERM GOAL #2   Title  understand ways to manage pelvic pain with meditation, possible home TENS unit, heat, diaphragmatic breathing    Status  Achieved      PT SHORT TERM GOAL #4   Title  understand perineal massage to relax the pelvic floor and prepare for penile penetration    Status  Achieved        PT Long Term Goals - 10/11/18 1624      PT LONG TERM GOAL #1   Title  independent with HEP and understand how to progress herself including workout program    Time  12    Period  Weeks    Status   On-going    Target Date  01/03/19      PT LONG TERM GOAL #2   Title  pain after intercourse is minimal due to decreased pelvic floor muscle tension and using heat    Baseline  marinoff 3/3    Time  12    Period  Weeks    Status  On-going    Target Date  01/03/19      PT LONG TERM GOAL #4   Title  walking 30 minutes 3 times per week due to abilty to relax the pelvic floor muscles with minimal pain    Baseline  can walk 15 minutes around class and it starts hurting    Time  12    Period  Weeks    Status  On-going    Target Date  01/03/19            Plan - 11/10/18 1650    Clinical Impression Statement  Patient had some good releases with sitting on foam roller.  She did well with quadruped but had some wrist pain after 10 reps.  She began to get some increased pain on forearms after 6 reps.  Pt had some relief at the end with stim and heat.     PT Treatment/Interventions  Biofeedback;Cryotherapy;Electrical Stimulation;Moist Heat;Ultrasound;Therapeutic exercise;Therapeutic activities;Neuromuscular re-education;Patient/family education;Manual techniques;Dry needling;Passive range of motion;Taping    PT Next Visit Plan  f/u on muscle control kegel with increased endurance, assess patients symptom response, dry needle to lumbar and thoracic multifidi as needed    Consulted and Agree with Plan of Care  Patient       Patient will benefit from skilled therapeutic intervention in order to improve the following deficits and impairments:  Increased fascial restricitons, Pain, Decreased mobility, Increased muscle spasms, Decreased activity tolerance, Decreased endurance, Decreased range of motion, Decreased strength, Impaired flexibility  Visit Diagnosis: Cramp and spasm  Muscle weakness (generalized)     Problem List There are no active problems to display for this patient.   Junious Silk, PT 11/10/2018, 5:03 PM  [facility] [address], [address] [place], [place], [address] Phone: [phone]   Fax:  [phone]  Name: Alayziah  PAULINE RITCHIE MRN: [id number] Date of Birth: 10-05-1985

## 2018-11-17 ENCOUNTER — Ambulatory Visit: Payer: BC Managed Care – PPO | Admitting: Physical Therapy

## 2018-11-17 ENCOUNTER — Other Ambulatory Visit: Payer: Self-pay

## 2018-11-17 DIAGNOSIS — R252 Cramp and spasm: Secondary | ICD-10-CM | POA: Diagnosis not present

## 2018-11-17 DIAGNOSIS — M6281 Muscle weakness (generalized): Secondary | ICD-10-CM

## 2018-11-17 NOTE — Therapy (Signed)
[facility] [address], [address] [place], [place], [address] Phone: [phone]   Fax:  [phone]  Physical Therapy Treatment  Patient Details  Name: Tara Castaneda MRN: [id number] Date of Birth: 06/19/1986 Referring Provider (PT): Dr. Marlin Canary   Encounter Date: 11/17/2018  PT End of Session - 11/17/18 1721    Visit Number  17    Date for PT Re-Evaluation  01/03/19    Authorization Type  [employer]    PT Start Time  1538    PT Stop Time  1624    PT Time Calculation (min)  46 min    Activity Tolerance  Patient tolerated treatment well       Past Medical History:  Diagnosis Date  . ADD (attention deficit disorder)   . Asthma    MILD - LAST FLARE UP Aug 30, 2017 OFFICE NOTE ON CHART  . DJD (degenerative joint disease)    L 4 TO L5 , ARTHRITIS RIGHT KNEE  . Eczema    MOSTLY ON HANDS - OCCAS PATCHES ELSWHERE  . GERD (gastroesophageal reflux disease)   . History of heartburn   . History of kidney stones   . KP (keratosis pilaris)    UPPER ARMS  . Migraines   . Pain    HX OF STRAINED BACK SINCE ACCIDENT IN CHILDHOOD   . PONV (postoperative nausea and vomiting)    SEVERE  . Scoliosis    SLIGHT  . Seasonal allergies    MOLD AND MILDEW    Past Surgical History:  Procedure Laterality Date  . CHOLECYSTECTOMY  DEC 2004  . CYSTOSCOPY WITH RETROGRADE PYELOGRAM, URETEROSCOPY AND STENT PLACEMENT Right 10/26/2013   Procedure: CYSTOSCOPY WITH RIGHT URETEROSCOPY,  AND RIGHT URETERAL STENT PLACEMENT,  LEFT CYSTOSCOPY WITH RETROGRADE;  Surgeon: Crist Fat, MD;  Location: [facility];  Service: Urology;  Laterality: Right;  . EXOSTECTOMY  04/2014   BONE SPUR REMOVED  . HYSTEROSCOPY W/D&C N/A 11/30/2017   Procedure: DILATATION AND CURETTAGE /HYSTEROSCOPY;  Surgeon: Mitchel Honour, DO;  Location: [facility];  Service: Gynecology;  Laterality: N/A;  . LAPAROSCOPY N/A 11/30/2017   Procedure:  LAPAROSCOPY DIAGNOSTIC possible removal of endometriosis;  Surgeon: Mitchel Honour, DO;  Location: [facility];  Service: Gynecology;  Laterality: N/A;  . LITHOTRIPSY  MAY 2011  . RIGHT KNEE ARTHROSCOPY     JAN 2003 AND MARCH 2004, AND DEC 2004 AND AUGUST 2008    There were no vitals filed for this visit.  Subjective Assessment - 11/17/18 1539    Subjective  Pt saw the doctor and she is on different medication and now deciding on surgery.      Currently in Pain?  Yes    Pain Score  4     Pain Location  Abdomen    Pain Orientation  Lower    Pain Descriptors / Indicators  Aching    Pain Type  Chronic pain    Pain Onset  More than a month ago    Pain Frequency  Intermittent    Multiple Pain Sites  No                       [facility] Adult PT Treatment/Exercise - 11/17/18 0001      Neuro Re-ed    Neuro Re-ed Details   tactile cues for increased circular contraction doing kegel in supine      Lumbar Exercises: Standing  Functional Squats  15 reps;2 seconds   mini with kegel   Lifting Limitations  forward bend no weight with cues to hinge at hip; add 6lb down to stool and up      Moist Heat Therapy   Number Minutes Moist Heat  10 Minutes    Moist Heat Location  --   abdomen     Electrical Stimulation   Electrical Stimulation Location  abdomen    Electrical Stimulation Action  IFC    Electrical Stimulation Parameters  to tolerance    Electrical Stimulation Goals  Pain      Manual Therapy   Manual therapy comments  identity confirmed and patient informed and given consent to perfrom internal soft tissue mobilization    Internal Pelvic Floor  bulbocavernosis and pubococcygeus Lt>Rt               PT Short Term Goals - 08/24/18 1720      PT SHORT TERM GOAL #2   Title  understand ways to manage pelvic pain with meditation, possible home TENS unit, heat, diaphragmatic breathing    Status  Achieved      PT SHORT TERM GOAL #4   Title   understand perineal massage to relax the pelvic floor and prepare for penile penetration    Status  Achieved        PT Long Term Goals - 10/11/18 1624      PT LONG TERM GOAL #1   Title  independent with HEP and understand how to progress herself including workout program    Time  12    Period  Weeks    Status  On-going    Target Date  01/03/19      PT LONG TERM GOAL #2   Title  pain after intercourse is minimal due to decreased pelvic floor muscle tension and using heat    Baseline  marinoff 3/3    Time  12    Period  Weeks    Status  On-going    Target Date  01/03/19      PT LONG TERM GOAL #4   Title  walking 30 minutes 3 times per week due to abilty to relax the pelvic floor muscles with minimal pain    Baseline  can walk 15 minutes around class and it starts hurting    Time  12    Period  Weeks    Status  On-going    Target Date  01/03/19            Plan - 11/17/18 1719    Clinical Impression Statement  Pt was tight on left side pubococcygeus > Rt.  Pt did well with functional movements and pelvic floor contraction with cues and using bar to give tactile cues to correct spine alignment.  Pt will benefit from skilled PT to progress core strength and soft tissue release as needed.    PT Treatment/Interventions  Biofeedback;Cryotherapy;Electrical Stimulation;Moist Heat;Ultrasound;Therapeutic exercise;Therapeutic activities;Neuromuscular re-education;Patient/family education;Manual techniques;Dry needling;Passive range of motion;Taping    PT Next Visit Plan  internal STM as needed, kegel strengthening circular contraction, functional movement with pelvic floor    Consulted and Agree with Plan of Care  Patient       Patient will benefit from skilled therapeutic intervention in order to improve the following deficits and impairments:  Increased fascial restricitons, Pain, Decreased mobility, Increased muscle spasms, Decreased activity tolerance, Decreased endurance,  Decreased range of motion, Decreased strength, Impaired flexibility  Visit Diagnosis: Cramp and  spasm  Muscle weakness (generalized)     Problem List There are no active problems to display for this patient.   Brayton Caves Tayshawn Purnell, PT 11/17/2018, 5:54 PM  [facility] [address], [address] [place], [place], [address] Phone: [phone]   Fax:  [phone]  Name: CURRY DULSKI MRN: [id number] Date of Birth: [birth year]

## 2018-11-24 ENCOUNTER — Ambulatory Visit: Payer: BC Managed Care – PPO | Admitting: Physical Therapy

## 2018-11-24 ENCOUNTER — Other Ambulatory Visit: Payer: Self-pay

## 2018-11-24 ENCOUNTER — Encounter: Payer: Self-pay | Admitting: Physical Therapy

## 2018-11-24 DIAGNOSIS — R252 Cramp and spasm: Secondary | ICD-10-CM

## 2018-11-24 DIAGNOSIS — M6281 Muscle weakness (generalized): Secondary | ICD-10-CM

## 2018-11-24 NOTE — Therapy (Addendum)
[facility] [address], [place], [place], [address] Phone: [phone]   Fax:  [phone]  Physical Therapy Treatment  Patient Details  Name: Tara Castaneda MRN: [id number] Date of Birth: 18-Apr-1986 Referring Provider (PT): Dr. Lesia Sago   Encounter Date: 11/24/2018  PT End of Session - 11/24/18 1422    Visit Number  18    Date for PT Re-Evaluation  01/03/19    Authorization Type  [employer] state [employer]    PT Start Time  3568    PT Stop Time  1440    PT Time Calculation (min)  42 min    Activity Tolerance  Patient tolerated treatment well    Behavior During Therapy  [facility] for tasks assessed/performed       Past Medical History:  Diagnosis Date  . ADD (attention deficit disorder)   . Asthma    MILD - LAST FLARE UP Aug 30, 2017 OFFICE NOTE ON CHART  . DJD (degenerative joint disease)    L 4 TO L5 , ARTHRITIS RIGHT KNEE  . Eczema    MOSTLY ON HANDS - OCCAS PATCHES ELSWHERE  . GERD (gastroesophageal reflux disease)   . History of heartburn   . History of kidney stones   . KP (keratosis pilaris)    UPPER ARMS  . Migraines   . Pain    HX OF STRAINED BACK SINCE ACCIDENT IN CHILDHOOD   . PONV (postoperative nausea and vomiting)    SEVERE  . Scoliosis    SLIGHT  . Seasonal allergies    MOLD AND MILDEW    Past Surgical History:  Procedure Laterality Date  . CHOLECYSTECTOMY  DEC 2004  . CYSTOSCOPY WITH RETROGRADE PYELOGRAM, URETEROSCOPY AND STENT PLACEMENT Right 10/26/2013   Procedure: CYSTOSCOPY WITH RIGHT URETEROSCOPY,  AND RIGHT URETERAL STENT PLACEMENT,  LEFT CYSTOSCOPY WITH RETROGRADE;  Surgeon: Ardis Hughs, MD;  Location: [facility];  Service: Urology;  Laterality: Right;  . EXOSTECTOMY  04/2014   BONE SPUR REMOVED  . HYSTEROSCOPY W/D&C N/A 11/30/2017   Procedure: DILATATION AND CURETTAGE /HYSTEROSCOPY;  Surgeon: Linda Hedges, DO;  Location: [facility];  Service: Gynecology;   Laterality: N/A;  . LAPAROSCOPY N/A 11/30/2017   Procedure: LAPAROSCOPY DIAGNOSTIC possible removal of endometriosis;  Surgeon: Linda Hedges, DO;  Location: [facility];  Service: Gynecology;  Laterality: N/A;  . LITHOTRIPSY  MAY 2011  . RIGHT KNEE ARTHROSCOPY     JAN 2003 AND MARCH 2004, AND DEC 2004 AND AUGUST 2008    There were no vitals filed for this visit.  Subjective Assessment - 11/24/18 1406    Subjective  Pt reports she has been doing well with not much pain lately    Currently in Pain?  Yes    Pain Score  3     Pain Location  Abdomen    Pain Orientation  Lower    Pain Descriptors / Indicators  Aching    Pain Type  Chronic pain    Pain Onset  More than a month ago    Pain Frequency  Intermittent    Multiple Pain Sites  No                       [facility] Adult PT Treatment/Exercise - 11/24/18 0001      Lumbar Exercises: Stretches   Active Hamstring Stretch  Right;Left;3 reps;20 seconds    Figure 4 Stretch  2 reps;30 seconds  Other Lumbar Stretch Exercise  trunk rotation LE on ball - 10x      Lumbar Exercises: Aerobic   UBE (Upper Arm Bike)  5 min 3 fwd, 2back - L2 cues for posture      Lumbar Exercises: Standing   Functional Squats  15 reps;2 seconds   mini with kegel and holding 8lb   Forward Lunge  10 reps   bil with slight increased pain in abdomen   Other Standing Lumbar Exercises  step up with kegel - 8:    Other Standing Lumbar Exercises  hip flexion, ext, abduction 2.5 lb 10x each               PT Short Term Goals - 08/24/18 1720      PT SHORT TERM GOAL #2   Title  understand ways to manage pelvic pain with meditation, possible home TENS unit, heat, diaphragmatic breathing    Status  Achieved      PT SHORT TERM GOAL #4   Title  understand perineal massage to relax the pelvic floor and prepare for penile penetration    Status  Achieved        PT Long Term Goals - 10/11/18 1624      PT LONG TERM GOAL #1    Title  independent with HEP and understand how to progress herself including workout program    Time  12    Period  Weeks    Status  On-going    Target Date  01/03/19      PT LONG TERM GOAL #2   Title  pain after intercourse is minimal due to decreased pelvic floor muscle tension and using heat    Baseline  marinoff 3/3    Time  12    Period  Weeks    Status  On-going    Target Date  01/03/19      PT LONG TERM GOAL #4   Title  walking 30 minutes 3 times per week due to abilty to relax the pelvic floor muscles with minimal pain    Baseline  can walk 15 minutes around class and it starts hurting    Time  12    Period  Weeks    Status  On-going    Target Date  01/03/19            Plan - 11/24/18 1430    Clinical Impression Statement  Pt was challenged with lunges.  Pt was monitored for SOB due to athsma and allergies.  Pt did well with performing exercises throughout session today.  At one point she began having increased abdominal pain but was able to lie down on her back and do stretches to manage pain.  She was then able to continue the rest of treatment.    PT Treatment/Interventions  Biofeedback;Cryotherapy;Electrical Stimulation;Moist Heat;Ultrasound;Therapeutic exercise;Therapeutic activities;Neuromuscular re-education;Patient/family education;Manual techniques;Dry needling;Passive range of motion;Taping    PT Next Visit Plan  review additions to HEP and review goals, most likely discharge next    PT Home Exercise Plan  Access Code: [email or web address]     Consulted and Agree with Plan of Care  Patient       Patient will benefit from skilled therapeutic intervention in order to improve the following deficits and impairments:  Increased fascial restricitons, Pain, Decreased mobility, Increased muscle spasms, Decreased activity tolerance, Decreased endurance, Decreased range of motion, Decreased strength, Impaired flexibility  Visit Diagnosis: Cramp and spasm  Muscle weakness  (generalized)  Problem List There are no active problems to display for this patient.   Jule Ser, PT 11/24/2018, 2:48 PM  [facility] [address], [place], [place], [address] Phone: [phone]   Fax:  [phone]  Name: Tara Castaneda MRN: [id number] Date of Birth: 24-Dec-1985  PHYSICAL THERAPY DISCHARGE SUMMARY  Visits from Start of Care: 18  Current functional level related to goals / functional outcomes: See above details   Remaining deficits: See above details   Education / Equipment: HEP  Plan: Patient agrees to discharge.  Patient goals were partially met. Patient is being discharged due to not returning since the last visit.  ?????    [employer], PT 02/10/19 2:23 PM

## 2018-11-26 ENCOUNTER — Encounter

## 2018-12-02 ENCOUNTER — Ambulatory Visit: Payer: BC Managed Care – PPO | Admitting: Physical Therapy

## 2020-01-14 IMAGING — CT CT ABD-PELV W/ CM
2 of 4 series · 16 of 46 positions shown, 18 images · IV contrast (iopamidol)
Comparison: 10/11/2013

CLINICAL DATA: Lower quadrant pain progressively worsening since
Sunday July, 2017. Prior cholecystectomy.

EXAM:
CT ABDOMEN AND PELVIS WITH CONTRAST
TECHNIQUE: Multidetector CT imaging of the abdomen and pelvis was performed
using the standard protocol following bolus administration of
intravenous contrast.
CONTRAST:  100mL MGYVB4-GJJ IOPAMIDOL (MGYVB4-GJJ) INJECTION 61%

[Series 2: abd pelvis 5.00 br40 s3 ax · axial · 0.83mm/px · z∈[+1174,+1594]mm · 13 of 92 slices shown, 15 images]
[im 4/92  soft-tissue]
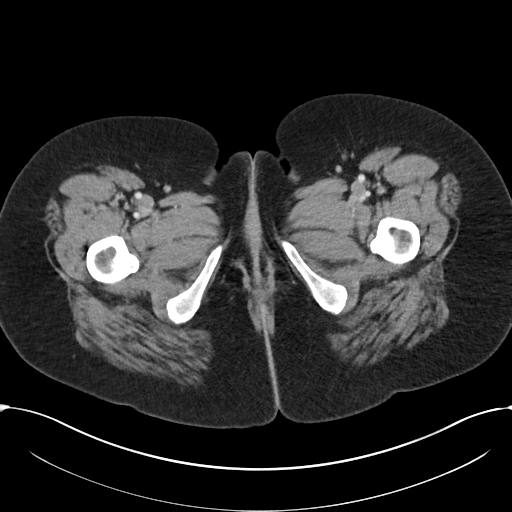
[im 4/92  bone]
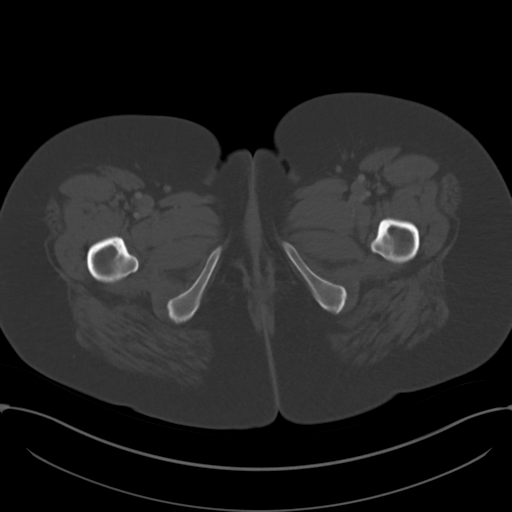
[im 12/92  soft-tissue]
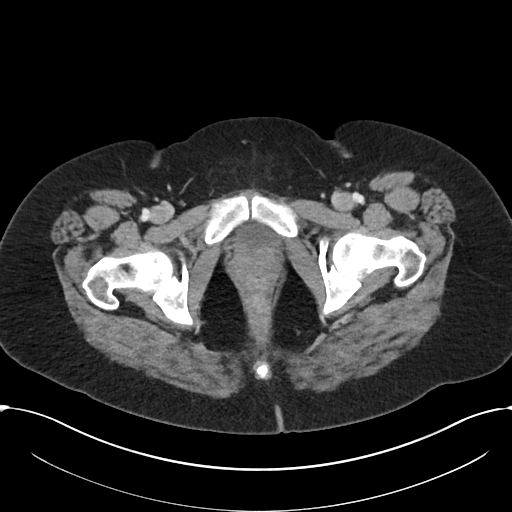
[im 19/92  soft-tissue]
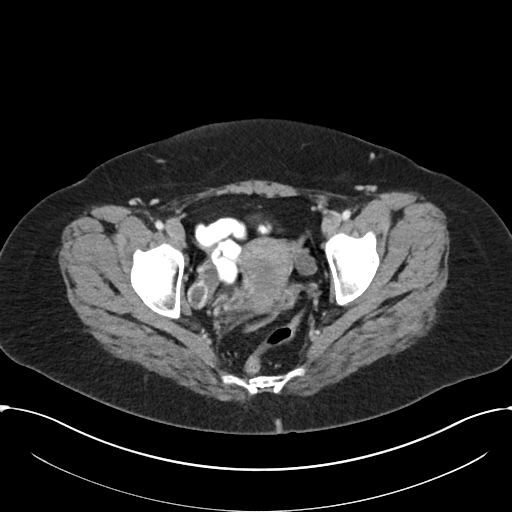
[im 27/92  soft-tissue]
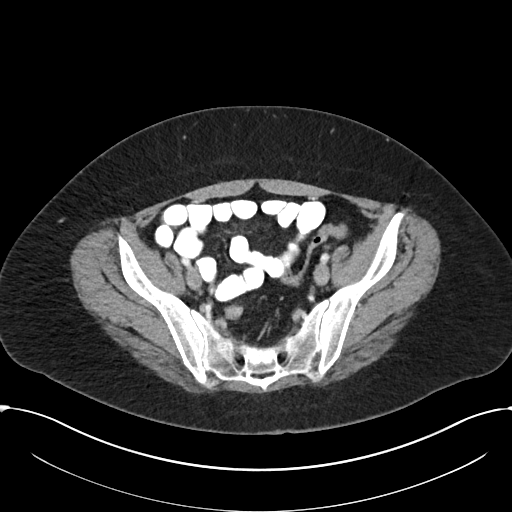
[im 31/92  soft-tissue]
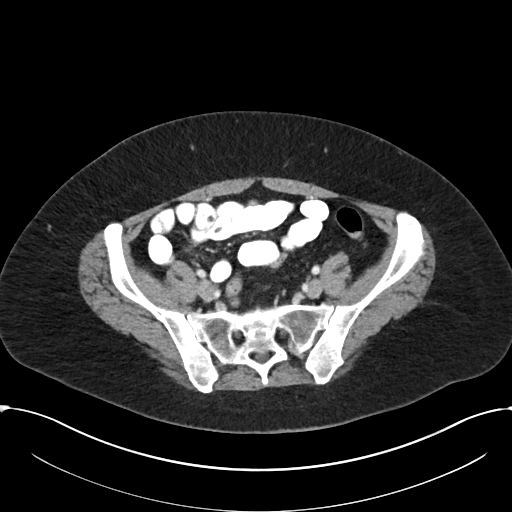
[im 38/92  soft-tissue]
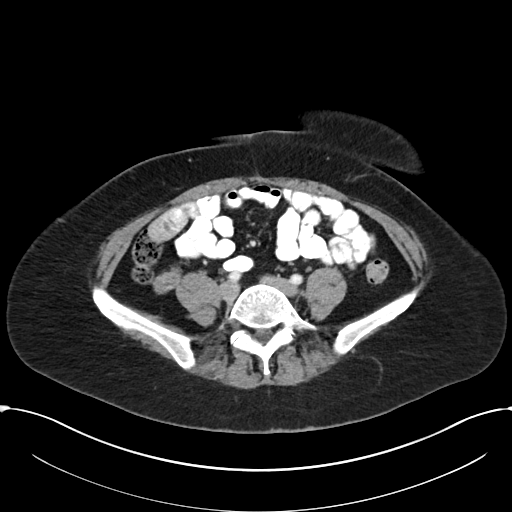
[im 46/92  soft-tissue]
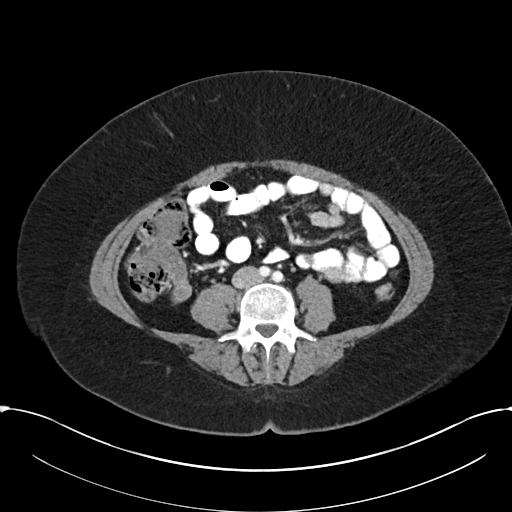
[im 54/92  soft-tissue]
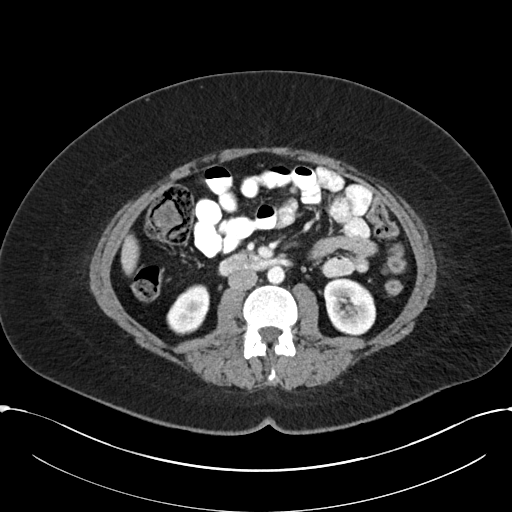
[im 61/92  soft-tissue]
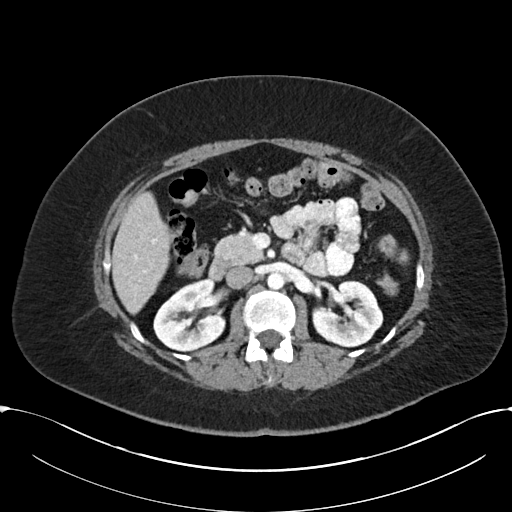
[im 61/92  bone]
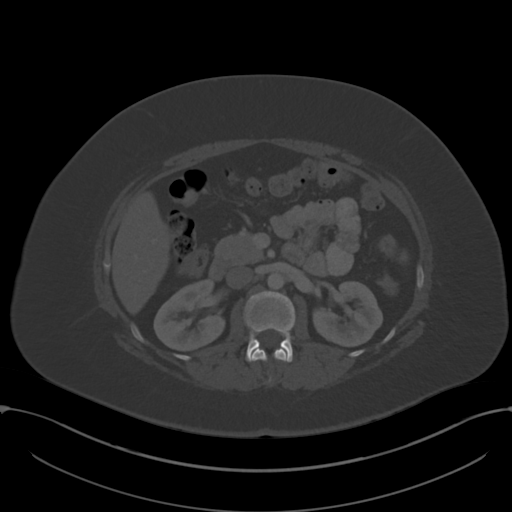
[im 65/92  soft-tissue]
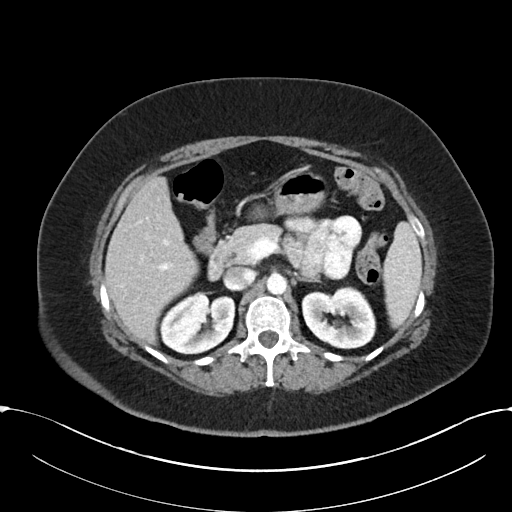
[im 73/92  soft-tissue]
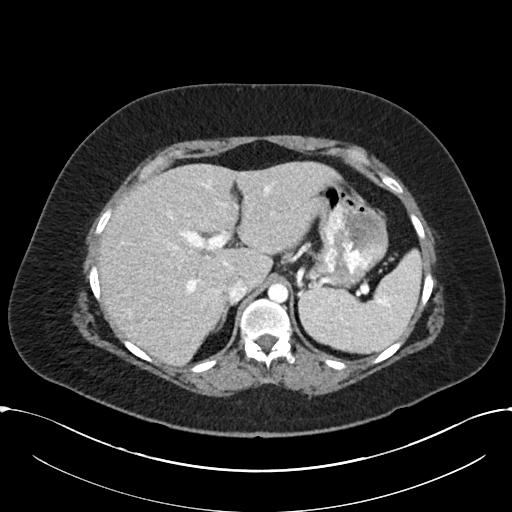
[im 80/92  soft-tissue]
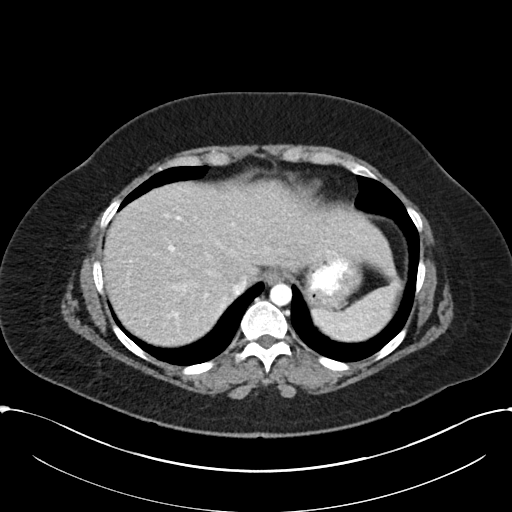
[im 88/92  soft-tissue]
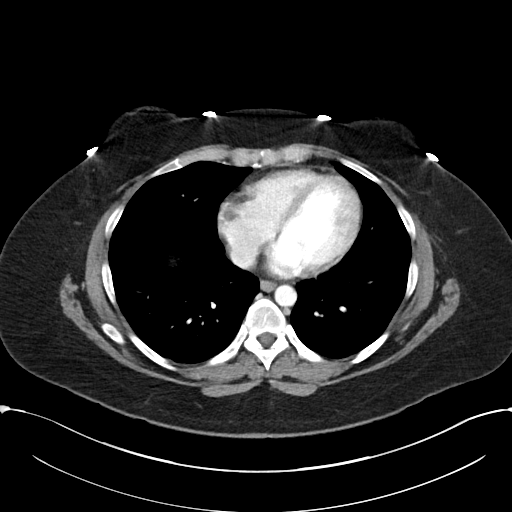

[Series 6: abd pelvis 2.00 br40 s3 cor · coronal · 0.83mm/px · 3 of 154 slices shown]
[im 52/154  soft-tissue]
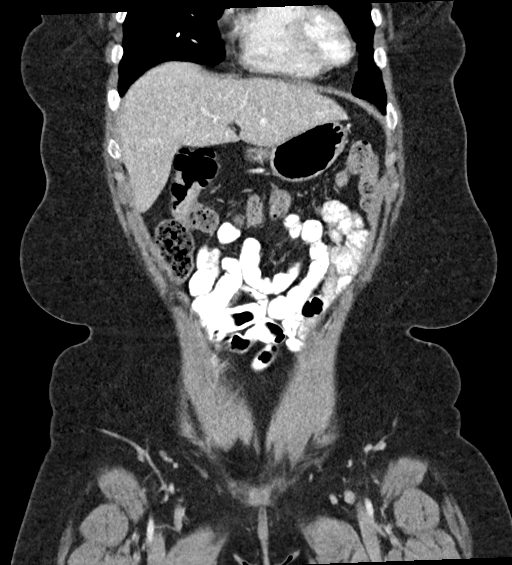
[im 69/154  soft-tissue]
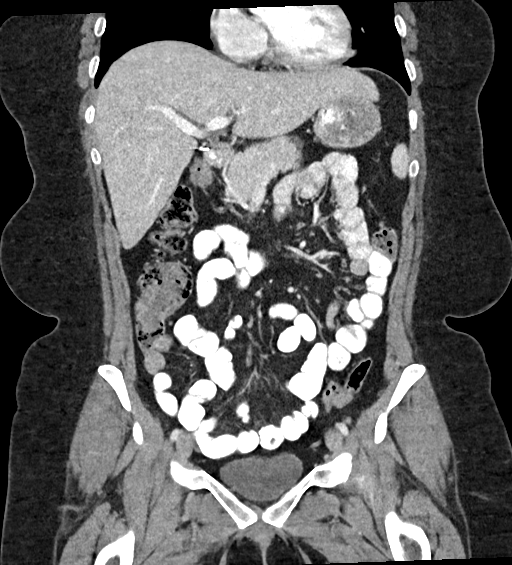
[im 86/154  soft-tissue]
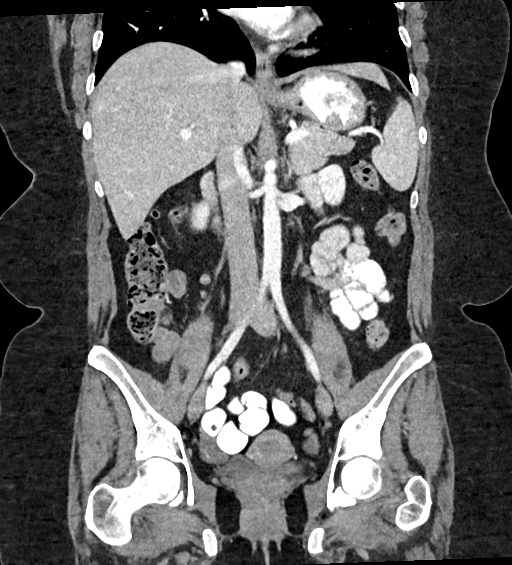

[16 of 46 positions shown; findings below may reference images not displayed]

FINDINGS: Lower chest: Unremarkable.

Hepatobiliary: No focal abnormality within the liver parenchyma.
Liver measures 17.5 cm craniocaudal length, upper normal to
borderline increased in size. Gallbladder surgically absent. No
intrahepatic or extrahepatic biliary dilation.

Pancreas: No focal mass lesion. No dilatation of the main duct. No
intraparenchymal cyst. No peripancreatic edema.

Spleen: No splenomegaly. No focal mass lesion.

Adrenals/Urinary Tract: No adrenal nodule or mass. 2 mm
nonobstructing stone identified in the lower pole the right kidney.
3 mm nonobstructing stone is identified in the upper pole of the
left kidney. Kidneys otherwise unremarkable. No evidence for
hydroureter. The urinary bladder appears normal for the degree of
distention.

Stomach/Bowel: Stomach is nondistended. No gastric wall thickening.
No evidence of outlet obstruction. Duodenum is normally positioned
as is the ligament of Treitz. No small bowel wall thickening. No
small bowel dilatation. The terminal ileum is normal. The appendix
is normal. No gross colonic mass. No colonic wall thickening. No
substantial diverticular change.

Vascular/Lymphatic: No abdominal aortic aneurysm. Portal vein and
superior mesenteric vein are patent. Splenic vein patent. Celiac
axis, SMA, and IMA unremarkable. There is no gastrohepatic or
hepatoduodenal ligament lymphadenopathy. No intraperitoneal or
retroperitoneal lymphadenopathy. No pelvic sidewall lymphadenopathy.

Reproductive: The uterus has normal CT imaging appearance. There is
no adnexal mass.

Other: No intraperitoneal free fluid.

Musculoskeletal: Bone windows reveal no worrisome lytic or sclerotic
osseous lesions.
IMPRESSION: 1. Borderline hepatomegaly.
2. Bilateral tiny nonobstructing renal stones.
3. Otherwise normal CT evaluation of the abdomen and pelvis.

## 2022-01-31 ENCOUNTER — Other Ambulatory Visit: Payer: Self-pay | Admitting: Otolaryngology

## 2022-01-31 DIAGNOSIS — J329 Chronic sinusitis, unspecified: Secondary | ICD-10-CM

## 2022-02-20 ENCOUNTER — Ambulatory Visit
Admission: RE | Admit: 2022-02-20 | Discharge: 2022-02-20 | Disposition: A | Discharge status: Home / Self Care | Payer: BC Managed Care – PPO | Source: Ambulatory Visit | Attending: Otolaryngology | Admitting: Otolaryngology

## 2022-02-20 ENCOUNTER — Encounter: Payer: Self-pay | Admitting: Radiology

## 2022-02-20 DIAGNOSIS — J329 Chronic sinusitis, unspecified: Secondary | ICD-10-CM

## 2022-02-27 ENCOUNTER — Other Ambulatory Visit: Payer: BC Managed Care – PPO

## 2023-08-27 ENCOUNTER — Emergency Department (HOSPITAL_COMMUNITY): Payer: BC Managed Care – PPO

## 2023-08-27 ENCOUNTER — Emergency Department (HOSPITAL_COMMUNITY)
Admission: EM | Admit: 2023-08-27 | Discharge: 2023-08-27 | Disposition: A | Discharge status: Home / Self Care | Payer: BC Managed Care – PPO | Attending: Emergency Medicine | Admitting: Emergency Medicine

## 2023-08-27 ENCOUNTER — Other Ambulatory Visit: Payer: Self-pay

## 2023-08-27 DIAGNOSIS — N201 Calculus of ureter: Secondary | ICD-10-CM | POA: Diagnosis not present

## 2023-08-27 DIAGNOSIS — R109 Unspecified abdominal pain: Secondary | ICD-10-CM | POA: Diagnosis present

## 2023-08-27 DIAGNOSIS — J45909 Unspecified asthma, uncomplicated: Secondary | ICD-10-CM | POA: Diagnosis not present

## 2023-08-27 DIAGNOSIS — Z7951 Long term (current) use of inhaled steroids: Secondary | ICD-10-CM | POA: Diagnosis not present

## 2023-08-27 LAB — COMPREHENSIVE METABOLIC PANEL
ALT: 25 U/L (ref 0–44)
AST: 21 U/L (ref 15–41)
Albumin: 3.9 g/dL (ref 3.5–5.0)
Alkaline Phosphatase: 45 U/L (ref 38–126)
Anion gap: 11 (ref 5–15)
BUN: 10 mg/dL (ref 6–20)
CO2: 21 mmol/L — ABNORMAL LOW (ref 22–32)
Calcium: 9 mg/dL (ref 8.9–10.3)
Chloride: 105 mmol/L (ref 98–111)
Creatinine, Ser: 0.73 mg/dL (ref 0.44–1.00)
GFR, Estimated: >60 mL/min (ref >60)
Glucose, Bld: 108 mg/dL — ABNORMAL HIGH (ref 70–99)
Potassium: 3.3 mmol/L — ABNORMAL LOW (ref 3.5–5.1)
Sodium: 137 mmol/L (ref 135–145)
Total Bilirubin: 0.6 mg/dL (ref <1.2)
Total Protein: 7.2 g/dL (ref 6.5–8.1)

## 2023-08-27 LAB — CBC WITH DIFFERENTIAL/PLATELET
Abs Immature Granulocytes: 0.01 10*3/uL (ref 0.00–0.07)
Basophils Absolute: 0 10*3/uL (ref 0.0–0.1)
Basophils Relative: 1 %
Eosinophils Absolute: 0 10*3/uL (ref 0.0–0.5)
Eosinophils Relative: 1 %
HCT: 42.7 % (ref 36.0–46.0)
Hemoglobin: 14.9 g/dL (ref 12.0–15.0)
Immature Granulocytes: 0 %
Lymphocytes Relative: 34 %
Lymphs Abs: 1.6 10*3/uL (ref 0.7–4.0)
MCH: 28.9 pg (ref 26.0–34.0)
MCHC: 34.9 g/dL (ref 30.0–36.0)
MCV: 82.8 fL (ref 80.0–100.0)
Monocytes Absolute: 0.5 10*3/uL (ref 0.1–1.0)
Monocytes Relative: 10 %
Neutro Abs: 2.6 10*3/uL (ref 1.7–7.7)
Neutrophils Relative %: 54 %
Platelets: 285 10*3/uL (ref 150–400)
RBC: 5.16 MIL/uL — ABNORMAL HIGH (ref 3.87–5.11)
RDW: 13.1 % (ref 11.5–15.5)
WBC: 4.7 10*3/uL (ref 4.0–10.5)
nRBC: 0 % (ref 0.0–0.2)

## 2023-08-27 LAB — URINALYSIS, ROUTINE W REFLEX MICROSCOPIC
Bilirubin Urine: NEGATIVE
Glucose, UA: NEGATIVE mg/dL
Ketones, ur: NEGATIVE mg/dL
Leukocytes,Ua: NEGATIVE
Nitrite: NEGATIVE
Protein, ur: 30 mg/dL — AB
Specific Gravity, Urine: 1.018 (ref 1.005–1.030)
pH: 7 (ref 5.0–8.0)

## 2023-08-27 LAB — HCG, SERUM, QUALITATIVE: Preg, Serum: NEGATIVE

## 2023-08-27 LAB — LIPASE, BLOOD: Lipase: 41 U/L (ref 11–51)

## 2023-08-27 MED ORDER — MORPHINE SULFATE (PF) 4 MG/ML IV SOLN
4.0000 mg | Freq: Once | INTRAVENOUS | Status: AC
Start: 2023-08-27 — End: 2023-08-27
  Administered 2023-08-27: 4 mg via INTRAVENOUS
  Filled 2023-08-27: qty 1

## 2023-08-27 MED ORDER — ONDANSETRON HCL 4 MG PO TABS: 4.0000 mg | ORAL_TABLET | Freq: Four times a day (QID) | ORAL | 0 refills | Status: DC

## 2023-08-27 MED ORDER — KETOROLAC TROMETHAMINE 10 MG PO TABS: 10.0000 mg | ORAL_TABLET | Freq: Four times a day (QID) | ORAL | 0 refills | Status: AC | PRN

## 2023-08-27 MED ORDER — KETOROLAC TROMETHAMINE 15 MG/ML IJ SOLN
15.0000 mg | Freq: Once | INTRAMUSCULAR | Status: AC
  Administered 2023-08-27: 15 mg via INTRAVENOUS
  Filled 2023-08-27: qty 1

## 2023-08-27 MED ORDER — SODIUM CHLORIDE 0.9 % IV BOLUS
1000.0000 mL | Freq: Once | INTRAVENOUS | Status: AC
  Administered 2023-08-27: 1000 mL via INTRAVENOUS

## 2023-08-27 MED ORDER — TAMSULOSIN HCL 0.4 MG PO CAPS: 0.4000 mg | ORAL_CAPSULE | Freq: Every day | ORAL | 0 refills | Status: AC

## 2023-08-27 MED ORDER — HYDROCODONE-ACETAMINOPHEN 5-325 MG PO TABS: 1.0000 | ORAL_TABLET | ORAL | 0 refills | Status: AC | PRN

## 2023-08-27 MED ORDER — ONDANSETRON HCL 4 MG/2ML IJ SOLN
4.0000 mg | Freq: Once | INTRAMUSCULAR | Status: AC
  Administered 2023-08-27: 4 mg via INTRAVENOUS
  Filled 2023-08-27: qty 2

## 2023-08-27 NOTE — ED Triage Notes (Signed)
Pt reports right sided flank pain that goes into abdomen. Pt reports pain starting approx 2 hours ago.

## 2023-08-27 NOTE — Discharge Instructions (Signed)
Evaluation today revealed that you have a 6 mm kidney stone on the right side.  Sending you home with Norco for pain and Flomax to help with passage of the stone.  Also recommend assertive hydration.  Please follow-up with urology.  If you have worsening flank pain, develop a fever, nausea vomiting diarrhea or any other concerning symptom please return emergency department further evaluation.

## 2023-08-27 NOTE — ED Provider Notes (Addendum)
[place] EMERGENCY DEPARTMENT AT [facility] Provider Note   CSN: [id number] Arrival date & time: 08/27/23  1914     History  Chief Complaint  Patient presents with   Flank Pain   HPI Tara Castaneda is a 37 y.o. female with a history of kidney stones, asthma and GERD presenting for right flank pain.  Started around 4 AM this morning.  The pain originated in the right flank but at times radiates to the right groin.  States she may have seen some blood in her urine a couple weeks ago but none recently.  Denies painful urination or malodorous urine.  Denies fever or chills.  Endorses some nausea and vomiting this morning.  Denies diarrhea.  States symptoms are similar to when she has had a kidney stone in the past.   Flank Pain       Home Medications Prior to Admission medications   Medication Sig Start Date End Date Taking? Authorizing Provider  HYDROcodone-acetaminophen (NORCO/VICODIN) 5-325 MG tablet Take 1 tablet by mouth every 4 (four) hours as needed. 08/27/23  Yes Riki Sheer K, PA-C  ondansetron (ZOFRAN) 4 MG tablet Take 1 tablet (4 mg total) by mouth every 6 (six) hours. 08/27/23  Yes Gareth Eagle, PA-C  tamsulosin (FLOMAX) 0.4 MG CAPS capsule Take 1 capsule (0.4 mg total) by mouth daily. 08/27/23  Yes Gareth Eagle, PA-C  albuterol (PROVENTIL HFA;VENTOLIN HFA) 108 (90 BASE) MCG/ACT inhaler Inhale 1 puff into the lungs every 6 (six) hours as needed for wheezing or shortness of breath.    [provider]  azelastine (ASTELIN) 0.1 % nasal spray Place 1 spray into both nostrils daily. Use in each nostril as directed    [provider]  diazepam (VALIUM) 5 MG tablet Take 5 mg by mouth every 6 (six) hours as needed for anxiety.    [provider]  fluticasone (FLONASE) 50 MCG/ACT nasal spray Place 1 spray into both nostrils daily.    [provider]  ibuprofen (ADVIL,MOTRIN) 800 MG tablet Take 1 tablet (800 mg total)  by mouth every 6 (six) hours as needed. 11/30/17   Morris, Aundra Millet, DO  levocetirizine (XYZAL) 5 MG tablet Take 5 mg by mouth every evening.    [provider]  mometasone-formoterol (DULERA) 200-5 MCG/ACT AERO Inhale 2 puffs into the lungs 2 (two) times daily.    [provider]  montelukast (SINGULAIR) 10 MG tablet Take 10 mg by mouth every morning.     [provider]  oxyCODONE-acetaminophen (PERCOCET) 5-325 MG tablet Take 1-2 tablets by mouth every 4 (four) hours as needed for severe pain. 11/30/17   Morris, Aundra Millet, DO  Potassium Citrate 15 MEQ (1620 MG) TBCR Take by mouth 2 (two) times daily.    [provider]  promethazine (PHENERGAN) 25 MG tablet Take 1 tablet (25 mg total) by mouth every 6 (six) hours as needed for nausea or vomiting. 05/31/18   Terrilee Files, MD  triamcinolone cream (KENALOG) 0.1 % Apply 1 application topically as needed.     [provider]  UNABLE TO FIND BEPREVE EYE DROP 1 DROP EACH EYE IN AM FOR ALLERGIES    [provider]  urea (CARMOL) 10 % cream Apply 1 application topically as needed.     [provider]      Allergies    Amoxicillin, Lactose intolerance (gi), Other, and Penicillins    Review of Systems   Review of Systems  Genitourinary:  Positive for flank pain.    Physical Exam Updated Vital Signs BP 136/89 (BP Location: Right Arm)   Pulse 80   Temp 98 F (36.7 C) (Oral)   Resp 18   Ht 5\' 2"  (1.575 m)   Wt 95.3 kg   LMP 11/03/2017 (Exact Date)   SpO2 100%   BMI 38.41 kg/m  Physical Exam Vitals and nursing note reviewed.  HENT:     Head: Normocephalic and atraumatic.     Mouth/Throat:     Mouth: Mucous membranes are moist.  Eyes:     General:        Right eye: No discharge.        Left eye: No discharge.     Conjunctiva/sclera: Conjunctivae normal.  Cardiovascular:     Rate and Rhythm: Normal rate and regular rhythm.     Pulses: Normal pulses.     Heart sounds: Normal  heart sounds.  Pulmonary:     Effort: Pulmonary effort is normal.     Breath sounds: Normal breath sounds.  Abdominal:     General: Abdomen is flat.     Palpations: Abdomen is soft.     Tenderness: There is right CVA tenderness.  Skin:    General: Skin is warm and dry.  Neurological:     General: No focal deficit present.  Psychiatric:        Mood and Affect: Mood normal.     ED Results / Procedures / Treatments   Labs (all labs ordered are listed, but only abnormal results are displayed) Labs Reviewed  CBC WITH DIFFERENTIAL/PLATELET - Abnormal; Notable for the following components:      Result Value   RBC 5.16 (*)    All other components within normal limits  COMPREHENSIVE METABOLIC PANEL - Abnormal; Notable for the following components:   Potassium 3.3 (*)    CO2 21 (*)    Glucose, Bld 108 (*)    All other components within normal limits  URINALYSIS, ROUTINE W REFLEX MICROSCOPIC - Abnormal; Notable for the following components:   APPearance HAZY (*)    Hgb urine dipstick SMALL (*)    Protein, ur 30 (*)    Bacteria, UA RARE (*)    All other components within normal limits  LIPASE, BLOOD  HCG, SERUM, QUALITATIVE    EKG None  Radiology CT Renal Stone Study Result Date: 08/27/2023 CLINICAL DATA:  Right-sided flank pain radiating to abdomen, symptoms for 2 hours EXAM: CT ABDOMEN AND PELVIS WITHOUT CONTRAST TECHNIQUE: Multidetector CT imaging of the abdomen and pelvis was performed following the standard protocol without IV contrast. RADIATION DOSE REDUCTION: This exam was performed according to the departmental dose-optimization program which includes automated exposure control, adjustment of the mA and/or kV according to patient size and/or use of iterative reconstruction technique. COMPARISON:  08/22/2020 FINDINGS: Lower chest: No acute pleural or parenchymal lung disease. Hepatobiliary: Cholecystectomy. Unremarkable unenhanced appearance of the liver. Pancreas:  Unremarkable unenhanced appearance. Spleen: Unremarkable unenhanced appearance. Adrenals/Urinary Tract: There is an obstructing 6 mm proximal right ureteral calculus, reference image 105/7. Mild to moderate right-sided hydronephrosis and proximal right hydroureter. There are other bilateral nonobstructing renal calculi identified, measuring less than 2 mm on the right and up to 6 mm on the left. The adrenals and bladder are unremarkable. Stomach/Bowel: No bowel obstruction or ileus. Normal retrocecal appendix. No bowel wall thickening or inflammatory change. Vascular/Lymphatic: No significant vascular findings are present. No enlarged abdominal or pelvic lymph nodes. Reproductive: Status post hysterectomy.  No adnexal masses. Other: No free fluid or free intraperitoneal gas. No abdominal wall hernia. Musculoskeletal: No acute or destructive bony abnormalities. Chronic T11 compression fracture noted. Reconstructed images demonstrate no additional findings. IMPRESSION: 1. Obstructing 6 mm proximal right ureteral calculus, with mild to moderate right-sided obstructive uropathy. 2. Other bilateral nonobstructing renal calculi as above. Electronically Signed   By: Sharlet Salina M.D.   On: 08/27/2023 10:34    Procedures Procedures    Medications Ordered in ED Medications  sodium chloride 0.9 % bolus 1,000 mL (0 mLs Intravenous Stopped 08/27/23 1114)  ondansetron (ZOFRAN) injection 4 mg (4 mg Intravenous Given 08/27/23 0721)  morphine (PF) 4 MG/ML injection 4 mg (4 mg Intravenous Given 08/27/23 0721)  ketorolac (TORADOL) 15 MG/ML injection 15 mg (15 mg Intravenous Given 08/27/23 1114)    ED Course/ Medical Decision Making/ A&P Clinical Course as of 08/27/23 1128  Thu Aug 27, 2023  0715 BMI (Calculated): 38.4 [JR]  1045 Anion gap: 11 [JR]    Clinical Course User Index [JR] Gareth Eagle, PA-C                                 Medical Decision Making Amount and/or Complexity of Data  Reviewed Labs: ordered. Decision-making details documented in ED Course. Radiology: ordered.  Risk Prescription drug management.   Initial Impression and Ddx 37 year old well-appearing female presenting for right flank pain.  Exam notable for right CVA tenderness.  DDx includes pyelonephritis, kidney stone, acute cholecystitis, appendicitis, other. Patient PMH that increases complexity of ED encounter:  history of kidney stones, asthma and GERD  Interpretation of Diagnostics - I independent reviewed and interpreted the labs as followed: Hematuria  - I independently visualized the following imaging with scope of interpretation limited to determining acute life threatening conditions related to emergency care: CT renal stone, which revealed:  1. Obstructing 6 mm proximal right ureteral calculus, with mild to moderate right-sided obstructive uropathy. 2. Other bilateral nonobstructing renal calculi as above.   Patient Reassessment and Ultimate Disposition/Management On reassessment, patient stated that pain is significantly improved but still present. Treated Toradol. Workup suggestive of ureteral stone with associated mild to moderate right-sided obstruction. Workup does not suggest impaired renal function or associated infection at this time.  Advised to follow-up with urology outpatient.  Sent home with Flomax and Norco.  Also advised NSAIDs for pain as well and assertive hydration.  Discussed pertinent return precautions.  Vital stable.  Discharged home in good condition.  Patient management required discussion with the following services or consulting groups:  None  Complexity of Problems Addressed Acute complicated illness or Injury  Additional Data Reviewed and Analyzed Further history obtained from: Past medical history and medications listed in the EMR and Prior ED visit notes  Patient Encounter Risk Assessment Prescriptions         Final Clinical Impression(s) / ED  Diagnoses Final diagnoses:  Ureterolithiasis    Rx / DC Orders ED Discharge Orders          Ordered    HYDROcodone-acetaminophen (NORCO/VICODIN) 5-325 MG tablet  Every 4 hours PRN        08/27/23 1053    tamsulosin (FLOMAX) 0.4 MG CAPS capsule  Daily        08/27/23 1053    ondansetron (ZOFRAN) 4 MG tablet  Every 6 hours        08/27/23 1128  Gareth Eagle, PA-C 08/27/23 1052    Gareth Eagle, PA-C 08/27/23 1128    Estelle June A, DO 08/30/23 1348

## 2023-08-28 ENCOUNTER — Other Ambulatory Visit: Payer: Self-pay | Admitting: Urology

## 2023-08-28 ENCOUNTER — Encounter (HOSPITAL_BASED_OUTPATIENT_CLINIC_OR_DEPARTMENT_OTHER): Payer: Self-pay | Admitting: Urology

## 2023-08-28 NOTE — Progress Notes (Signed)
Talked with patient. Instructions given. Arrival time  76. Nothing to eat or drink after MN. Hx and meds reviewed. To bring in folder on Monday. Husband is the driver

## 2023-08-31 ENCOUNTER — Encounter (HOSPITAL_BASED_OUTPATIENT_CLINIC_OR_DEPARTMENT_OTHER): Payer: Self-pay | Admitting: Urology

## 2023-08-31 ENCOUNTER — Ambulatory Visit (HOSPITAL_COMMUNITY): Payer: BC Managed Care – PPO

## 2023-08-31 ENCOUNTER — Ambulatory Visit (HOSPITAL_BASED_OUTPATIENT_CLINIC_OR_DEPARTMENT_OTHER)
Admission: RE | Admit: 2023-08-31 | Discharge: 2023-08-31 | Disposition: A | Discharge status: Home / Self Care | Payer: BC Managed Care – PPO | Attending: Urology | Admitting: Urology

## 2023-08-31 ENCOUNTER — Encounter (HOSPITAL_BASED_OUTPATIENT_CLINIC_OR_DEPARTMENT_OTHER)
Admission: RE | Disposition: A | Discharge status: Home / Self Care | Payer: Self-pay | Source: Home / Self Care | Attending: Urology

## 2023-08-31 DIAGNOSIS — Z841 Family history of disorders of kidney and ureter: Secondary | ICD-10-CM | POA: Diagnosis not present

## 2023-08-31 DIAGNOSIS — N201 Calculus of ureter: Secondary | ICD-10-CM | POA: Insufficient documentation

## 2023-08-31 DIAGNOSIS — Z6838 Body mass index (BMI) 38.0-38.9, adult: Secondary | ICD-10-CM | POA: Insufficient documentation

## 2023-08-31 DIAGNOSIS — J45909 Unspecified asthma, uncomplicated: Secondary | ICD-10-CM | POA: Insufficient documentation

## 2023-08-31 DIAGNOSIS — E669 Obesity, unspecified: Secondary | ICD-10-CM | POA: Insufficient documentation

## 2023-08-31 DIAGNOSIS — G4730 Sleep apnea, unspecified: Secondary | ICD-10-CM | POA: Insufficient documentation

## 2023-08-31 HISTORY — PX: EXTRACORPOREAL SHOCK WAVE LITHOTRIPSY: SHX1557

## 2023-08-31 HISTORY — DX: Sleep apnea, unspecified: G47.30

## 2023-08-31 SURGERY — EXTRACORPOREAL SHOCK WAVE LITHOTRIPSY (ESWL)
Anesthesia: LOCAL | Laterality: Left

## 2023-08-31 MED ORDER — SODIUM CHLORIDE 0.9 % IV SOLN: INTRAVENOUS | Status: DC

## 2023-08-31 MED ORDER — DIAZEPAM 5 MG PO TABS
ORAL_TABLET | ORAL | Status: AC
  Filled 2023-08-31: qty 2

## 2023-08-31 MED ORDER — CIPROFLOXACIN HCL 500 MG PO TABS
500.0000 mg | ORAL_TABLET | ORAL | Status: AC
  Administered 2023-08-31: 500 mg via ORAL

## 2023-08-31 MED ORDER — SODIUM CHLORIDE 0.9% FLUSH: 3.0000 mL | Freq: Two times a day (BID) | INTRAVENOUS | Status: DC

## 2023-08-31 MED ORDER — CIPROFLOXACIN HCL 500 MG PO TABS
ORAL_TABLET | ORAL | Status: AC
  Filled 2023-08-31: qty 1

## 2023-08-31 MED ORDER — DIAZEPAM 5 MG PO TABS
10.0000 mg | ORAL_TABLET | ORAL | Status: AC
  Administered 2023-08-31: 10 mg via ORAL

## 2023-08-31 MED ORDER — ONDANSETRON HCL 4 MG PO TABS: 4.0000 mg | ORAL_TABLET | Freq: Four times a day (QID) | ORAL | 0 refills | Status: AC | PRN

## 2023-08-31 MED ORDER — DIPHENHYDRAMINE HCL 25 MG PO CAPS
25.0000 mg | ORAL_CAPSULE | ORAL | Status: AC
  Administered 2023-08-31: 25 mg via ORAL

## 2023-08-31 MED ORDER — DIPHENHYDRAMINE HCL 25 MG PO CAPS
ORAL_CAPSULE | ORAL | Status: AC
  Filled 2023-08-31: qty 1

## 2023-08-31 NOTE — Interval H&P Note (Signed)
History and Physical Interval Note:  No change in stone location.  08/31/2023 11:50 AM  Tara Castaneda  has presented today for surgery, with the diagnosis of LEFT URETERAL STONE.  The various methods of treatment have been discussed with the patient and family. After consideration of risks, benefits and other options for treatment, the patient has consented to  Procedure(s): LEFT EXTRACORPOREAL SHOCK WAVE LITHOTRIPSY (ESWL) (Left) as a surgical intervention.  The patient's history has been reviewed, patient examined, no change in status, stable for surgery.  I have reviewed the patient's chart and labs.  Questions were answered to the patient's satisfaction.     Bjorn Pippin

## 2023-08-31 NOTE — H&P (Signed)
Right obstructing ureteral stone.   Patient presents today for follow-up from the ER where she was seen several days ago with acute onset right-sided flank pain. She was noted to have a right mid ureteral stone. She was sent home with pain medication, antinausea medication, and tamsulosin on medical expulsion therapy. The patient denies any fevers or chills. Her pain is being fairly well-controlled, but she is taking pain medication fiber fairly regularly. She has been taking Toradol in addition. She denies any voiding symptoms. She denies any fevers or chills.   The patient has a long history of stones. I follow her very closely for that. We have been following a stone in her left kidney. In the past have done ureteroscopy on her. She has had lithotripsy before, but not here in [place]. She takes potassium citrate for her stone prevention.     ALLERGIES: Amoxicillin TABS Penicillins Retin-A CREA    MEDICATIONS: Omeprazole 20 mg capsule,delayed release  Tamsulosin Hcl 0.4 mg capsule  Vicodin  Albuterol 90 mcg aerosol Inhalation  Azelastine Hcl 137 mcg (0.1 %) aerosol, spray with pump  Clarinex 5 mg tablet  Concerta  Eletriptan Hbr 40 mg tablet  Flonase 50 MCG/ACT Nasal Suspension Nasal  Magnesium  Miralax 17 gram powder in packet  Nurtec Odt 75 mg tablet,disintegrating  Pataday Twice Daily Relief 0.1 % drops  Plaquenil 200 mg tablet  Trelegy Ellipta 200 mcg-62.5 mcg-25 mcg/actuation blister, with inhalation device  Triamcinolone Acetonide 0.1 % External Cream External  Ubrelvy  Urea 40 % External Cream External  Xyzal  Zofran     GU PSH: Cystoscopy Insert Stent - 2015 Cystoscopy Ureteroscopy - 2015 ESWL - 2014       PSH Notes: Foot Surgery, Cystoscopy With Ureteroscopy Right, Cystoscopy With Insertion Of Ureteral Stent Right, Arthroscopy Knee Right, Arthroscopy Knee Right, Arthroscopy Knee Right, Lithotripsy, Gallbladder Surgery   NON-GU PSH: Knee Arthroscopy; Dx - 2014,  2014, 2014 Laparoscopy; Biopsy - 2019     GU PMH: Renal calculus - 06/23/2022, - 2022, - 2021, - 2021, - 2020, - 2019 (Chronic), The patient has a long history of kidney stones which are being actively treated with potassium citrate 30 mEq twice daily. She's done very well on this regimen. She is tolerating it well. We don't have any updated images, but did get an ultrasound last summer which was normal. We will plan to follow-up again with the patient in 1 year with a 24-hour urine collection and x-ray prior., - 2017, Nephrolithiasis, - 2016 Hypocitraturia - 2022, - 2021 Ureteral calculus - 2021, Calculus of distal right ureter, - 2015 Flank Pain - 2017 Hydronephrosis Unspec, Hydronephrosis, right - 2015 Urinary Tract Inf, Unspec site, Pyuria - 2015 Endometriosis, uterus      PMH Notes: Hx renal tubular acidosis. She has had 2 bouts of renal colic.   The patient states that she eats a moderate amount of calcium or dairy, minimal amount of red meat, chicken, and fish. She has eliminated soda and her diet and drinks very little caffeine. She drinks a lot of water and note that she should produce approximately 2 L of urine per day.   10/2011, BMP: sodium 141, potassium 4.0, chloride 109, bicarbonate 23, BUN 10, creatinine 0.8, calcium 9.0.   09/2014: 24 hour urinalysis: notably pH was 7.022 and Citrate was 226. Her ssCaPhos was 1.82. Volume, Ca,Ox, Uric acid were good. She is also consuming light-colored sodas with citric acid.   She has passed three stones - the  first one pass with conservative management for the second one required ureteral stent and shock wave lithotripsy. Her last surgical intervention was in 2012. She had Negative exploration in 2016.   03/2015: increased potassium citrate to 30 mEq twice daily. 24 urine analysis: citrate increased to 317, her pH was 7.4 and the remainder of her results significantly improved. Her calcium was exception which was 269, previously listed been  169, this is not been her problem in the past. Her potassium is also elevated which is suggestive that she has indeed been compliant with her potassium citrate.     NON-GU PMH: Encounter for general adult medical examination without abnormal findings, Encounter for preventive health examination - 2016 Allergy status to unspecified drugs, medicaments and biological substances, Allergies - 2014 Asthma, Asthma - 2014 Personal history of other diseases of the digestive system, History of esophageal reflux - 2014 Personal history of other diseases of the nervous system and sense organs, History of migraine headaches - 2014    FAMILY HISTORY: Diabetes - Mother Hypertension - Father, Brother multiple sclerosis - Mother nephrolithiasis - Runs In Family renal failure - Grandmother   SOCIAL HISTORY: Marital Status: Married Preferred Language: English; Ethnicity: Not Hispanic Or Latino; Race: White     Notes: Never A Smoker, Marital History - Currently Married, Occupation:, Caffeine Use, Alcohol Use   REVIEW OF SYSTEMS:    GU Review Female:   Patient denies frequent urination, hard to postpone urination, burning /pain with urination, get up at night to urinate, leakage of urine, stream starts and stops, trouble starting your stream, have to strain to urinate, and being pregnant.  Gastrointestinal (Upper):   Patient reports nausea and vomiting. Patient denies indigestion/ heartburn.  Gastrointestinal (Lower):   Patient denies diarrhea and constipation.  Constitutional:   Patient denies fever, night sweats, weight loss, and fatigue.  Skin:   Patient denies skin rash/ lesion and itching.  Eyes:   Patient denies blurred vision and double vision.  Ears/ Nose/ Throat:   Patient denies sore throat and sinus problems.  Hematologic/Lymphatic:   Patient denies swollen glands and easy bruising.  Cardiovascular:   Patient denies leg swelling and chest pains.  Respiratory:   Patient denies cough and shortness  of breath.  Endocrine:   Patient denies excessive thirst.  Musculoskeletal:   Patient reports back pain. Patient denies joint pain.  Neurological:   Patient denies headaches and dizziness.  Psychologic:   Patient denies depression and anxiety.   VITAL SIGNS:      08/28/2023 03:00 PM  Weight 210 lb / 95.25 kg  Height 62 in / 157.48 cm  BP 128/84 mmHg  Pulse 91 /min  BMI 38.4 kg/m   MULTI-SYSTEM PHYSICAL EXAMINATION:    Constitutional: Well-nourished. No physical deformities. Normally developed. Good grooming.  Respiratory: Normal breath sounds. No labored breathing, no use of accessory muscles.   Cardiovascular: Regular rate and rhythm. No murmur, no gallop. Normal temperature, normal extremity pulses, no swelling, no varicosities.   Gastrointestinal: No mass, no tenderness, no rigidity, non obese abdomen. Mild right CVA tenderness     Complexity of Data:  Source Of History:  Patient  Records Review:   Previous Doctor Records, Previous Patient Records, POC Tool  Urine Test Review:   Urinalysis  Urodynamics Review:   Review Bladder Scan  X-Ray Review: C.T. Abdomen/Pelvis: Reviewed Films. Discussed With Patient.     PROCEDURES:         KUB - 16109  A single view of  the abdomen is obtained. Renal shadows are easily visualized bilaterally. There are no stones appreciated within the expected location in either renal pelvis. There does appear to be a calcification on the right side at L4 consistent with the patient's known stone. There are no additional calcifications along the expected location of either ureter bilaterally.  Gas pattern is grossly normal. No significant bony abnormalities.      Impression: Larina Bras is well-visualized between L3 and L4 on her right side.          Visit Complexity - G2211          Urinalysis w/Scope Dipstick Dipstick Cont'd Micro  Color: Yellow Bilirubin: Neg mg/dL WBC/hpf: NS (Not Seen)  Appearance: Slightly Cloudy Ketones: Neg mg/dL RBC/hpf:  NS (Not Seen)  Specific Gravity: 1.025 Blood: Neg ery/uL Bacteria: Rare (0-9/hpf)  pH: 6.0 Protein: Trace mg/dL Cystals: NS (Not Seen)  Glucose: Neg mg/dL Urobilinogen: 0.2 mg/dL Casts: NS (Not Seen)    Nitrites: Neg Trichomonas: Not Present    Leukocyte Esterase: Neg leu/uL Mucous: Not Present      Epithelial Cells: 0 - 5/hpf      Yeast: NS (Not Seen)      Sperm: Not Present    Notes: Micro perfromed on unspun urine    ASSESSMENT:      ICD-10 Details  1 GU:   Ureteral calculus - N20.1    PLAN:            Medications New Meds: Tramadol Hcl 50 mg tablet 1-2 tablet PO Q 6 H   #20  0 Refill(s)  Pharmacy Name:  [id number]  Address:  [address]   [place], [place] [id number]  Phone:  [phone]  Fax:  [phone]            Orders X-Rays: KUB          Schedule         Document Letter(s):  Created for Patient: Clinical Summary         Notes:   The patient has stone in the right that is easily visualized on KUB. In the past she has responded well to lithotripsy. We discussed management strategies for this, and she is opted to proceed with shockwave lithotripsy. We got her scheduled for Monday with Dr. Annabell Howells. I went through lithotripsy with her in detail. We discussed the risk of failure as well as the risk of hematoma. I am sending in some tramadol for the patient. She will contact us over the weekend if her pain gets unbearable.           Next Appointment:      Next Appointment: 08/31/2023 11:15 AM    Appointment Type: Surgery     Location: [facility] [phone]    Provider: Bjorn Pippin, M.D.    Reason for Visit: OP--NE--RIGHT ESWL

## 2023-09-04 ENCOUNTER — Encounter (HOSPITAL_BASED_OUTPATIENT_CLINIC_OR_DEPARTMENT_OTHER): Payer: Self-pay | Admitting: Urology

## 2024-04-16 IMAGING — CT CT MAXILLOFACIAL W/O CM
2 of 3 series · 14 of 37 positions shown, 17 images · non-contrast
Comparison: None Available.

CLINICAL DATA: Chronic sinusitis.  Hoarseness.

EXAM:
CT MAXILLOFACIAL WITHOUT CONTRAST
TECHNIQUE: Multidetector CT images of the paranasal sinuses were obtained using
the standard protocol without intravenous contrast.
RADIATION DOSE REDUCTION: This exam was performed according to the
departmental dose-optimization program which includes automated
exposure control, adjustment of the mA and/or kV according to
patient size and/or use of iterative reconstruction technique.

[Series 3: axial soft · axial · 0.33mm/px · z∈[-217,-91]mm · 12 of 75 slices shown, 15 images]
[im 6/75  brain]
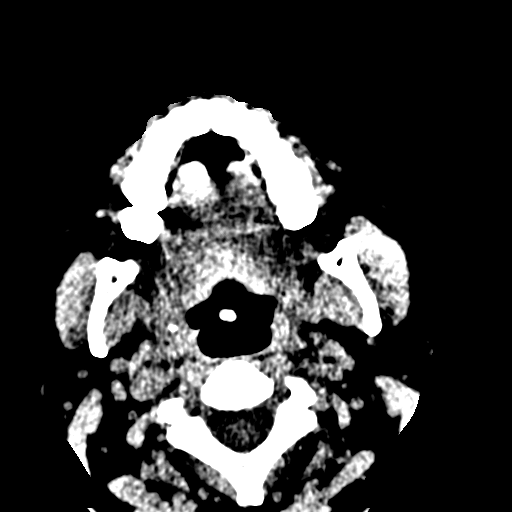
[im 6/75  bone]
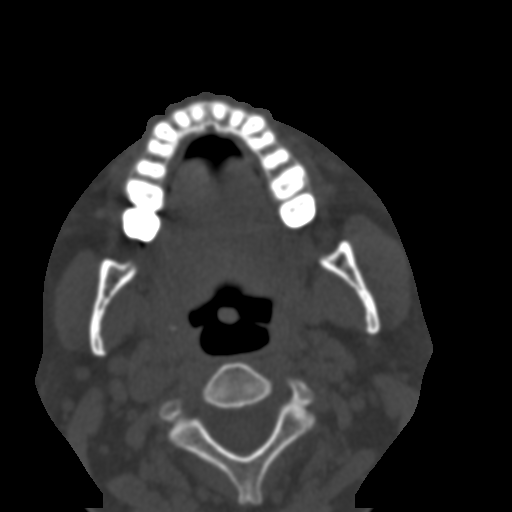
[im 11/75  bone]
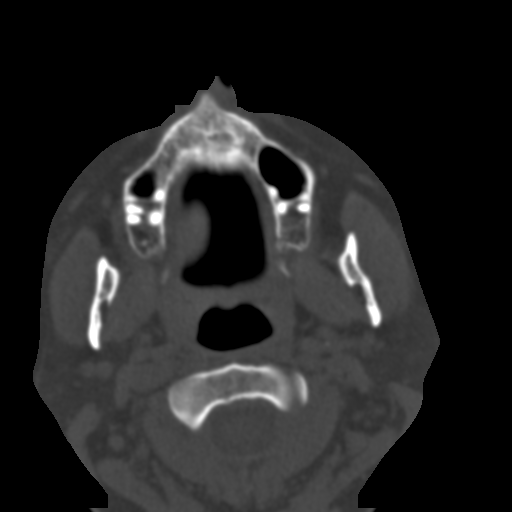
[im 16/75  bone]
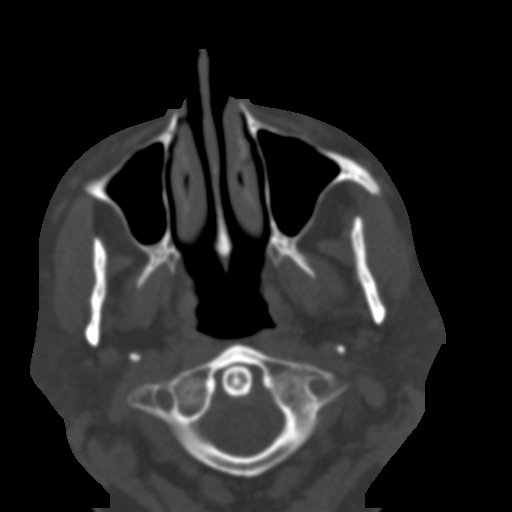
[im 23/75  bone]
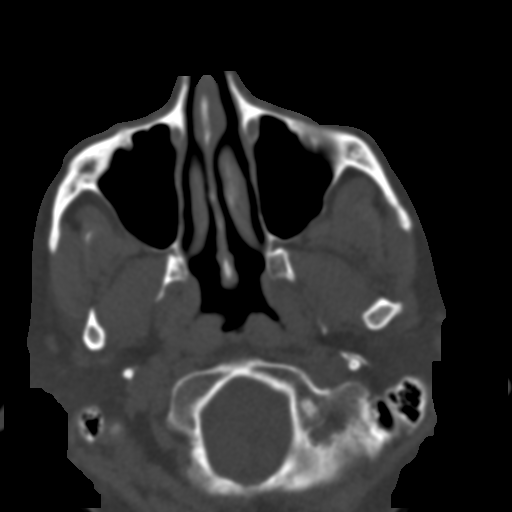
[im 29/75  brain]
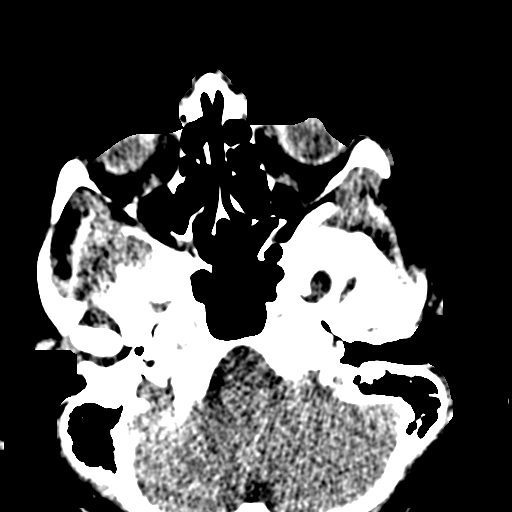
[im 29/75  bone]
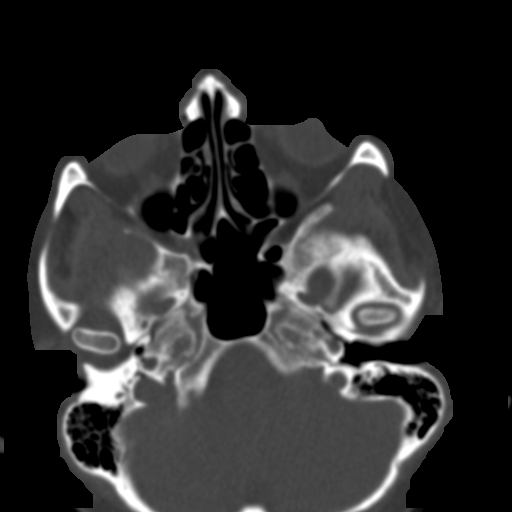
[im 34/75  bone]
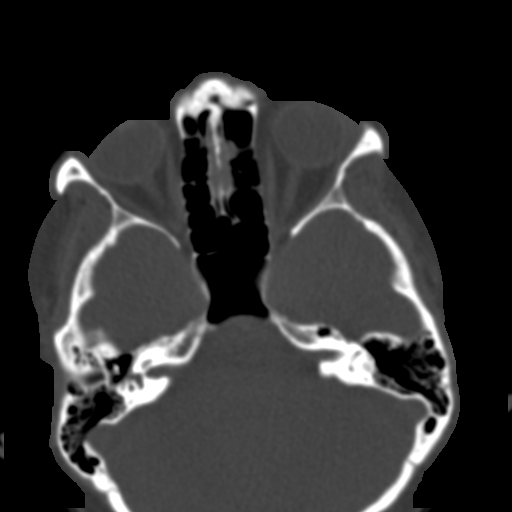
[im 41/75  bone]
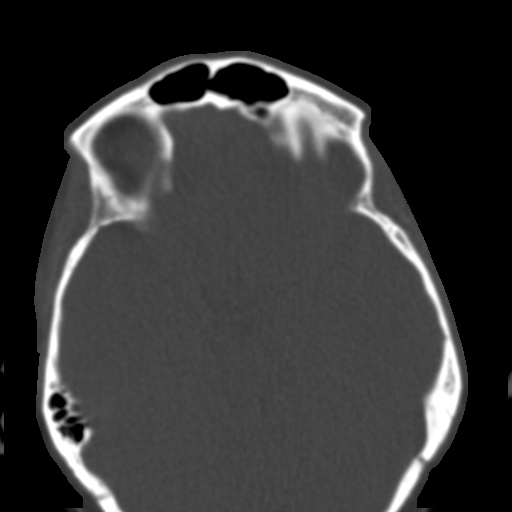
[im 46/75  bone]
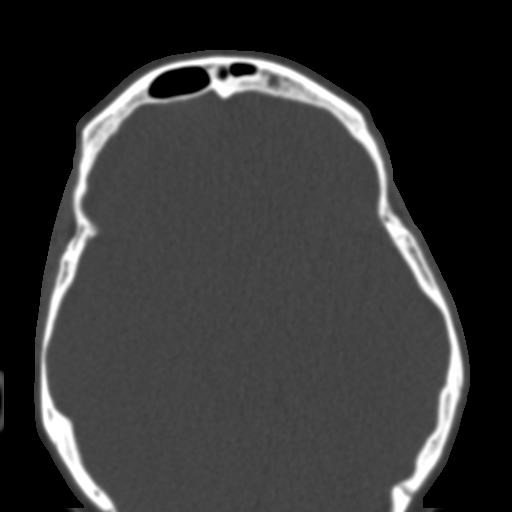
[im 52/75  brain]
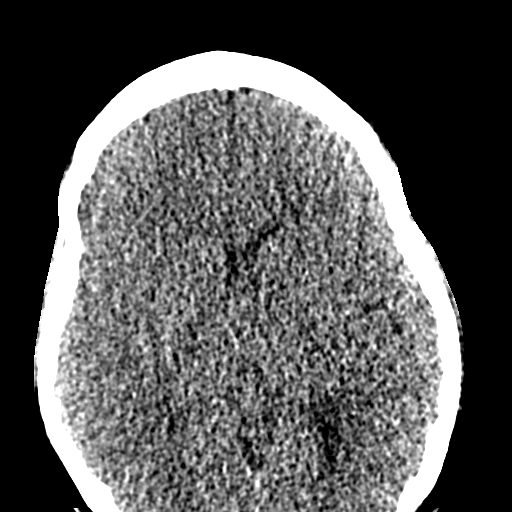
[im 52/75  bone]
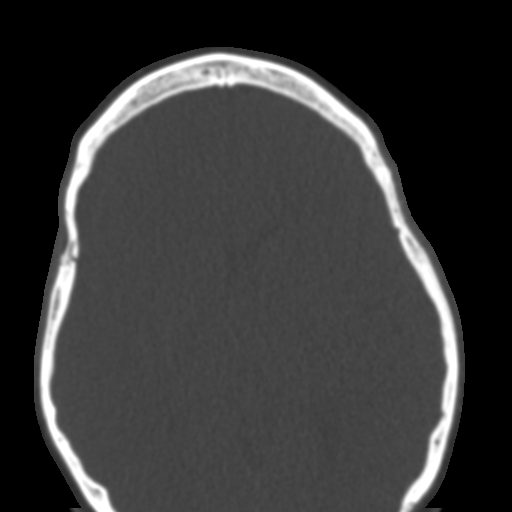
[im 59/75  bone]
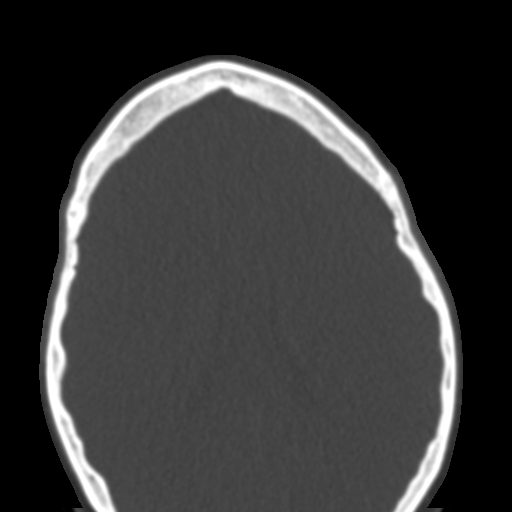
[im 64/75  bone]
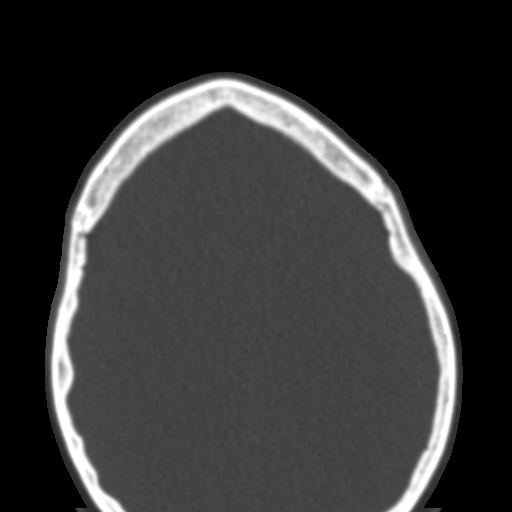
[im 69/75  bone]
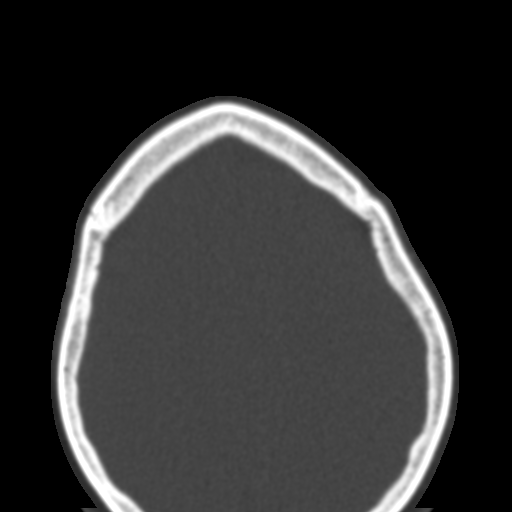

[Series 7: sagittal soft · sagittal · 0.29mm/px · 2 of 102 slices shown]
[im 34/102  bone]
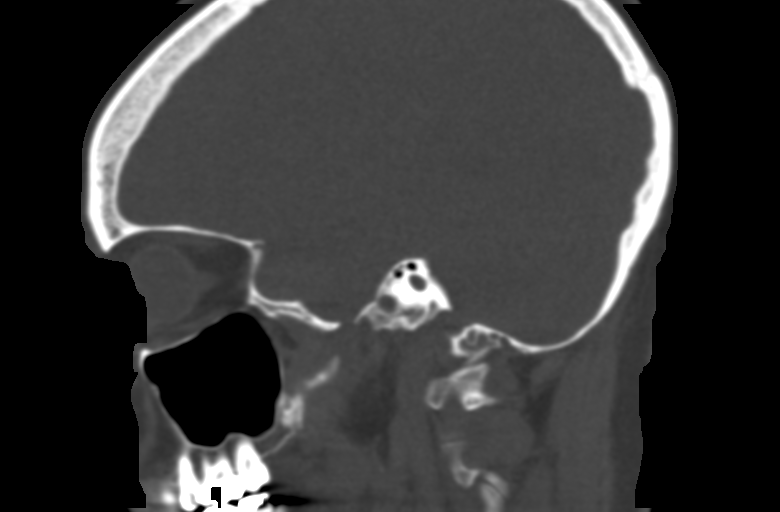
[im 68/102  bone]
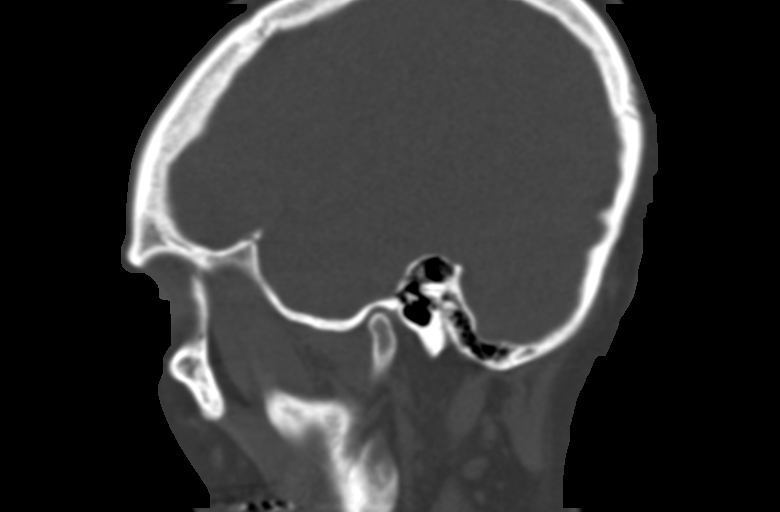

[14 of 37 positions shown; findings below may reference images not displayed]

FINDINGS: Paranasal sinuses:

Frontal: Trace mucosal thickening in the region of the left frontal
recess, otherwise clear bilaterally.

Ethmoid: Normally aerated.

Maxillary: Normally aerated.

Sphenoid: Normally aerated. Patent sphenoethmoidal recesses.

Right ostiomeatal unit: Patent.

Left ostiomeatal unit: Narrowing/effacement of the ethmoid
infundibulum by a prominent ethmoid bulla.

Nasal passages: Patent. 3 mm rightward deviation of an intact nasal
septum.

Anatomy: No pneumatization superior to anterior ethmoid notches.
Keros II. Sellar sphenoid pneumatization pattern. No dehiscence of
carotid or optic canals. No onodi cell.

Other: Clear mastoid air cells and middle ear cavities. Unremarkable
appearance of the orbits and included portion of the brain.
IMPRESSION: 1. No evidence of significant inflammatory sinus disease.
2. Nasal septal deviation.

## 2024-05-20 NOTE — Progress Notes (Signed)
  Subjective:    Patient ID: Tara Castaneda is a 38 y.o. female here for a Flu Vaccine and Covid Vaccination visit.     Are you sick today?: No  Have you ever had a serious reaction to any vaccine in the past?: No (FlowSheet update)  Have you felt dizzy or faint before, during or after an immunization?: No (FlowSheet update)  Does the patient agree to be seated in a chair with arms or lie on the exam table?: Yes Does the patient agree to be observed for 15 minutes post-procedure to assess for reaction or fainting risk?: Yes Do you have any concerns receiving a vaccine in either arm (history of shoulder injury, mastectomy or other surgery?): No   Any patient-supplied  information was reviewed and discussed with the patient. : Tara Castaneda as reviewed.  Has the VIS been reviewed? Please offer a paper copy of the VIS. Patient may decline: Yes (FlowSheet update)      Lifestyle: Tara Castaneda reports that she has never smoked. She has never used smokeless tobacco.      Objective:    Assessment/Plan:   Vaccine administered in accordance with [facility] guidelines.   Patient advised to contact [facility] if adverse event occurs.

## 2024-05-20 NOTE — Progress Notes (Signed)
 Tara Castaneda

## 2024-05-20 NOTE — Progress Notes (Signed)
  Subjective:    Patient ID: Tara Castaneda is a 38 y.o. female here for a Flu Vaccine and Covid Vaccination visit.     Are you sick today?: No  Have you ever had a serious reaction to any vaccine in the past?: No (FlowSheet update)  Have you felt dizzy or faint before, during or after an immunization?: No (FlowSheet update)  Does the patient agree to be seated in a chair with arms or lie on the exam table?: Yes Does the patient agree to be observed for 15 minutes post-procedure to assess for reaction or fainting risk?: Yes Do you have any concerns receiving a vaccine in either arm (history of shoulder injury, mastectomy or other surgery?): No   Any patient-supplied  information was reviewed and discussed with the patient. : Oneil as reviewed.  Has the VIS been reviewed? Please offer a paper copy of the VIS. Patient may decline: Yes (FlowSheet update)      Lifestyle: Tara Castaneda reports that she has never smoked. She has never used smokeless tobacco.      Objective:    Assessment/Plan:   Vaccine administered in accordance with [facility] guidelines.   Patient advised to contact [facility] if adverse event occurs.   Pt reports hx of asthma and pneumonia
